# Patient Record
Sex: Female | Born: 1990
Health system: Southern US, Community
[De-identification: ages and names within clinical notes are randomized; demographics above are authoritative.]

## PROBLEM LIST (undated history)

## (undated) DIAGNOSIS — E282 Polycystic ovarian syndrome: Secondary | ICD-10-CM

## (undated) DIAGNOSIS — T7840XA Allergy, unspecified, initial encounter: Secondary | ICD-10-CM

## (undated) DIAGNOSIS — M25569 Pain in unspecified knee: Secondary | ICD-10-CM

## (undated) DIAGNOSIS — F32A Depression, unspecified: Secondary | ICD-10-CM

## (undated) DIAGNOSIS — R779 Abnormality of plasma protein, unspecified: Secondary | ICD-10-CM

## (undated) DIAGNOSIS — I10 Essential (primary) hypertension: Secondary | ICD-10-CM

## (undated) DIAGNOSIS — E559 Vitamin D deficiency, unspecified: Secondary | ICD-10-CM

## (undated) DIAGNOSIS — F329 Major depressive disorder, single episode, unspecified: Secondary | ICD-10-CM

## (undated) DIAGNOSIS — Z6841 Body Mass Index (BMI) 40.0 and over, adult: Secondary | ICD-10-CM

## (undated) DIAGNOSIS — L83 Acanthosis nigricans: Secondary | ICD-10-CM

## (undated) DIAGNOSIS — N915 Oligomenorrhea, unspecified: Secondary | ICD-10-CM

## (undated) HISTORY — DX: Abnormality of plasma protein, unspecified: R77.9

## (undated) HISTORY — DX: Acanthosis nigricans: L83

## (undated) HISTORY — DX: Allergy, unspecified, initial encounter: T78.40XA

## (undated) HISTORY — DX: Morbid (severe) obesity due to excess calories: E66.01

## (undated) HISTORY — DX: Polycystic ovarian syndrome: E28.2

## (undated) HISTORY — DX: Major depressive disorder, single episode, unspecified: F32.9

## (undated) HISTORY — DX: Depression, unspecified: F32.A

## (undated) HISTORY — DX: Essential (primary) hypertension: I10

## (undated) HISTORY — DX: Vitamin D deficiency, unspecified: E55.9

## (undated) HISTORY — DX: Pain in unspecified knee: M25.569

## (undated) HISTORY — DX: Oligomenorrhea, unspecified: N91.5

## (undated) HISTORY — DX: Body Mass Index (BMI) 40.0 and over, adult: Z684

---
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
## 2010-01-30 IMAGING — CR DG CHEST 2V
1 series · 2 of 2 positions shown · non-contrast
Comparison: none

REASON FOR EXAM: chest pain
COMMENTS:

[Series 1: view not recorded · 0.17mm/px · 2 of 2 slices shown]
[im 1/2]
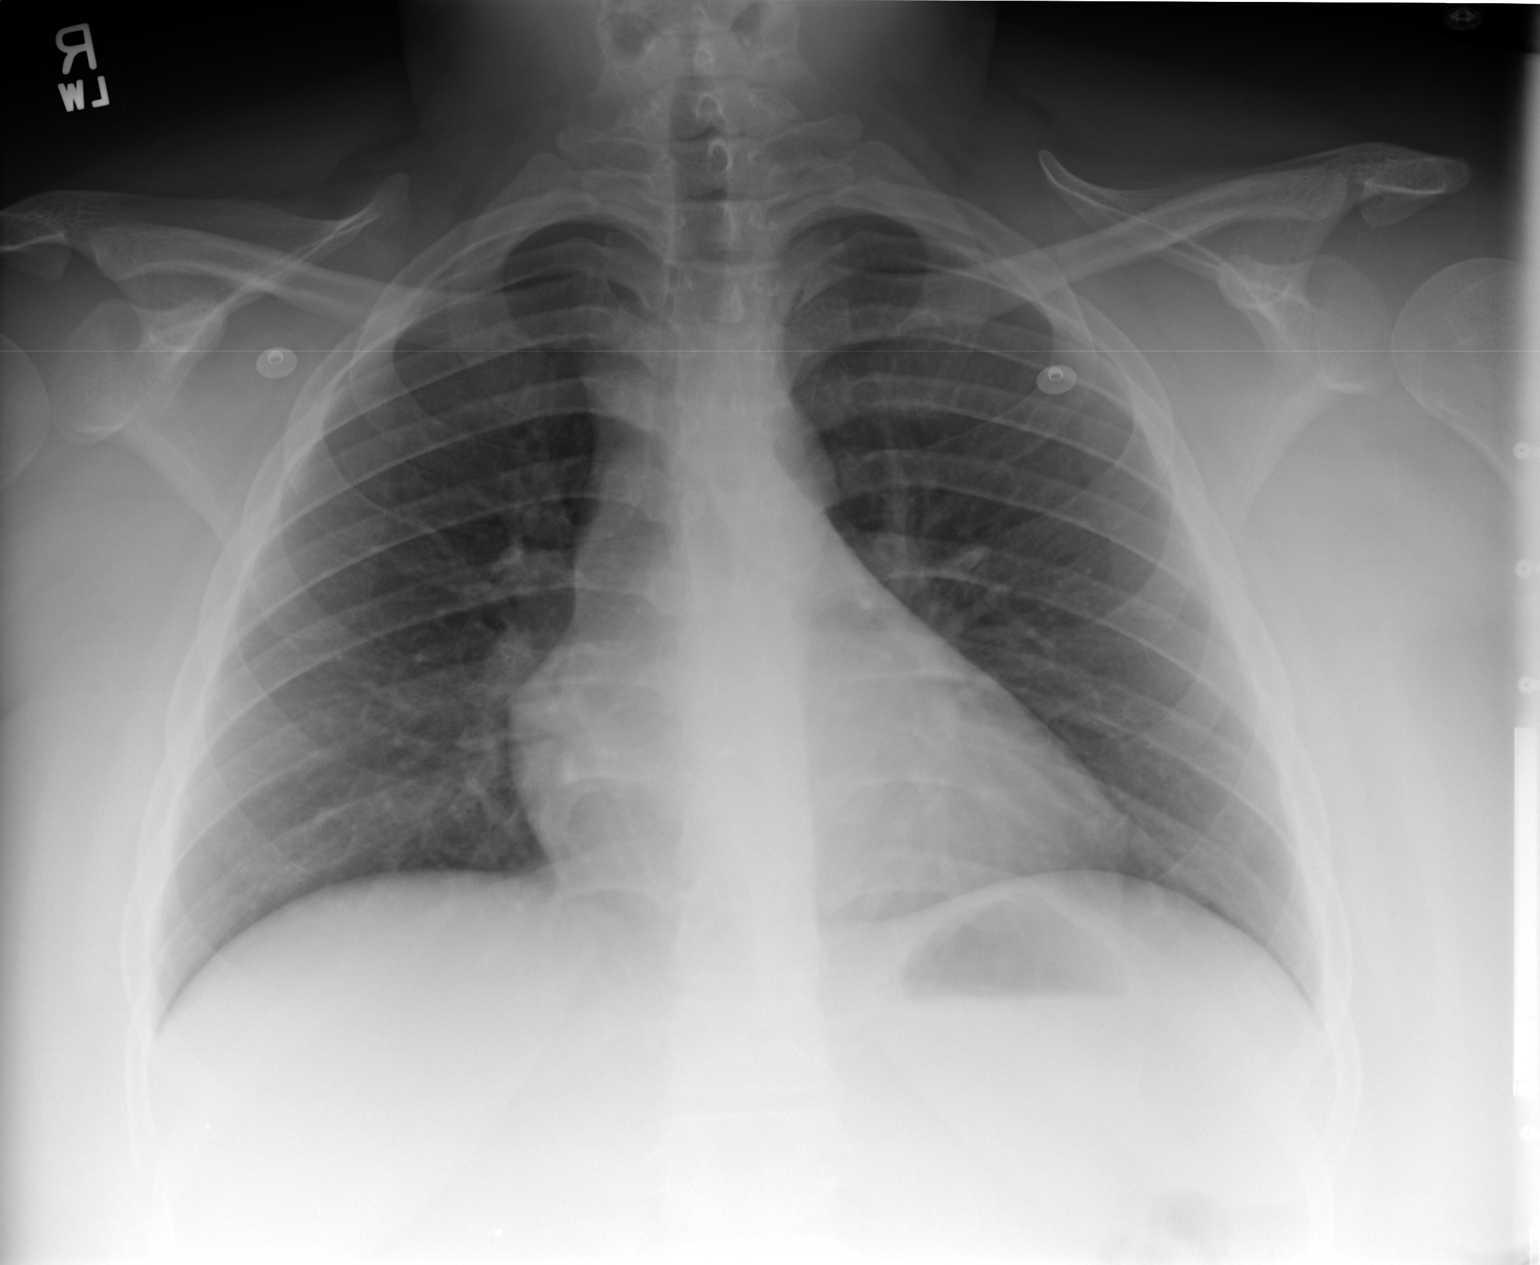
[im 2/2]
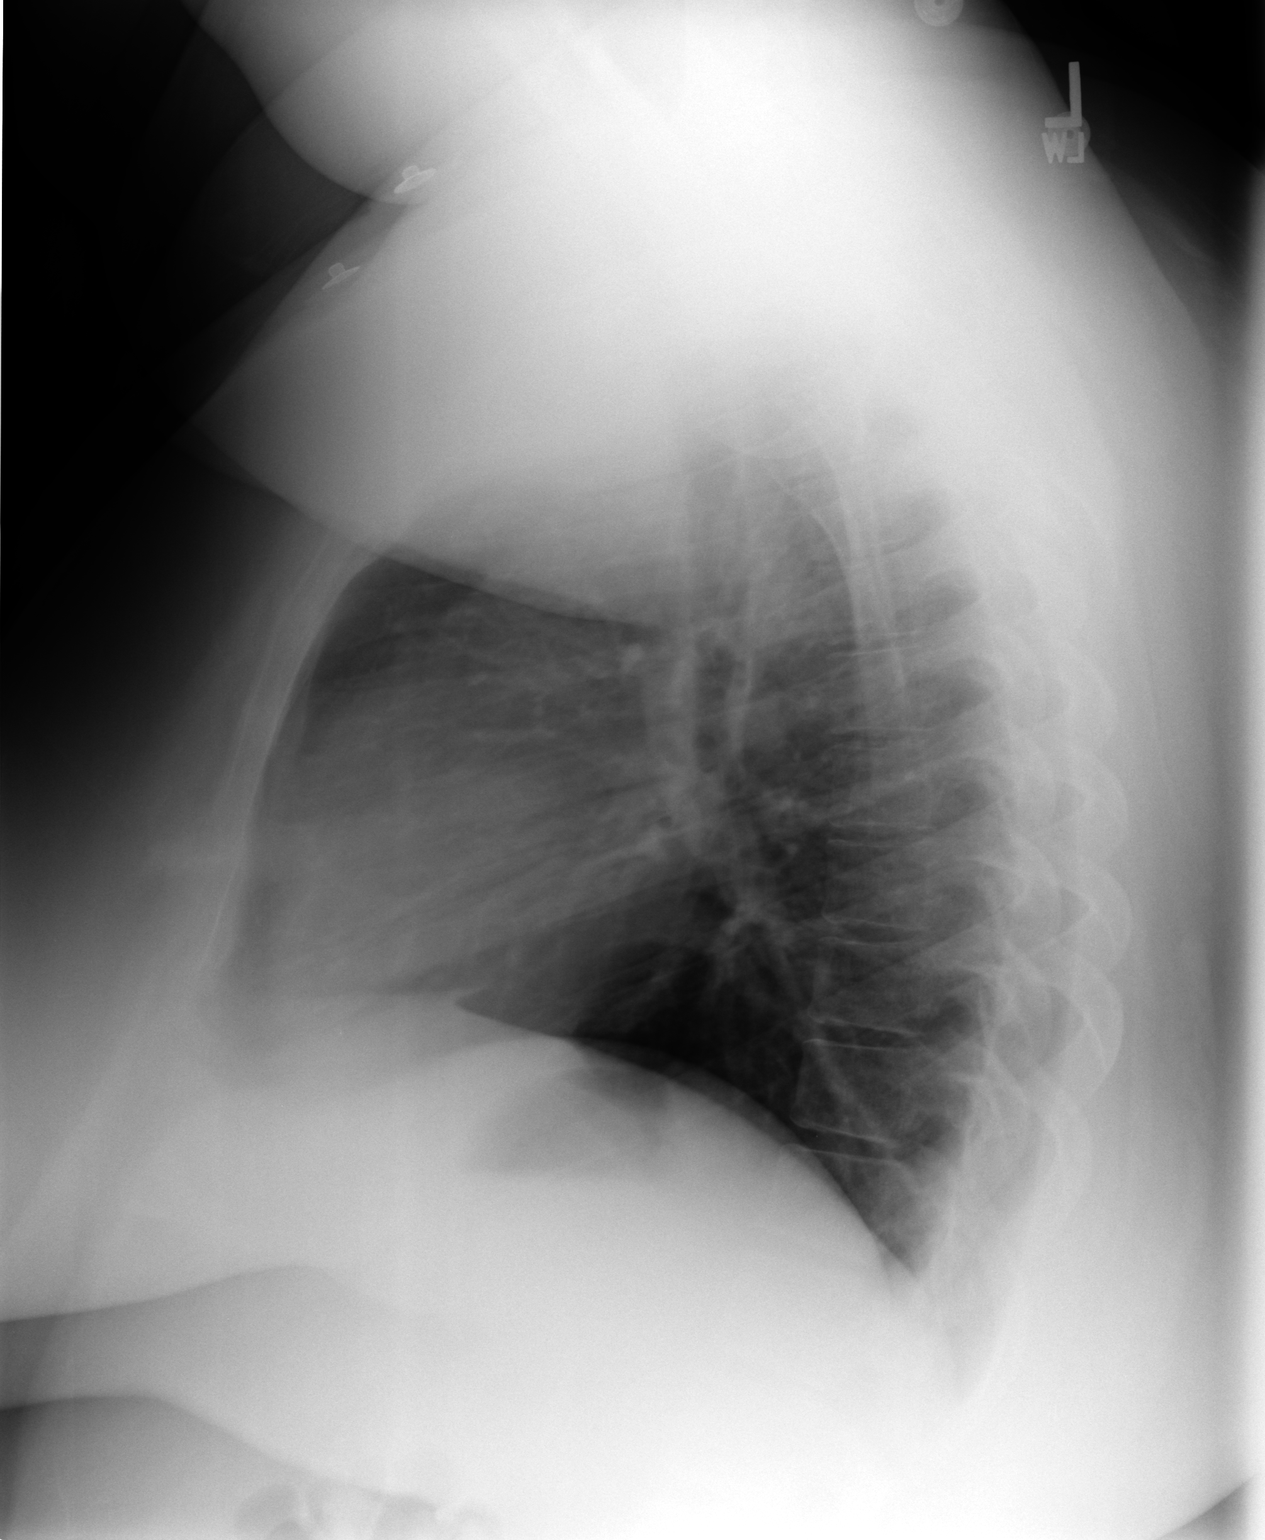

[2 of 2 positions shown; findings below may reference images not displayed]

PROCEDURE:     DXR - DXR CHEST PA (OR AP) AND LATERAL  - March 28, 2008 [DATE]

RESULT:     There is no previous exam for comparison.

The lungs are clear. The heart and pulmonary vessels are normal. The bony
and mediastinal structures are unremarkable. There is no effusion. There is
no pneumothorax or evidence of congestive failure.
IMPRESSION: No acute cardiopulmonary disease.

## 2010-02-06 IMAGING — CT CT HEAD WITHOUT CONTRAST
2 series · 16 of 30 positions shown, 20 images · non-contrast
Comparison: none

REASON FOR EXAM: vision change
COMMENTS:

[Series 2: without · axial · non-contrast · 0.41mm/px · z∈[+203,+328]mm · 13 of 31 slices shown, 17 images]
[im 3/31  brain]
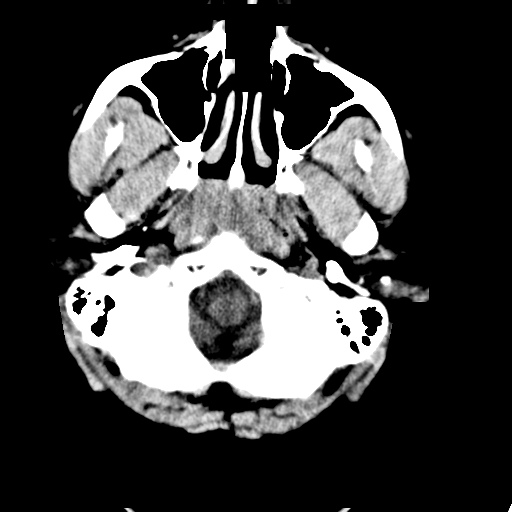
[im 3/31  bone]
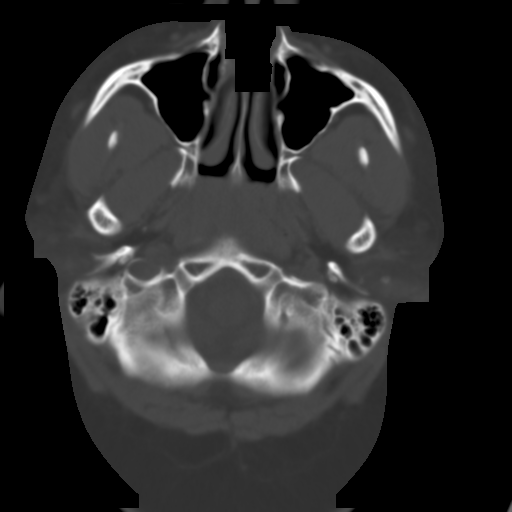
[im 5/31  brain]
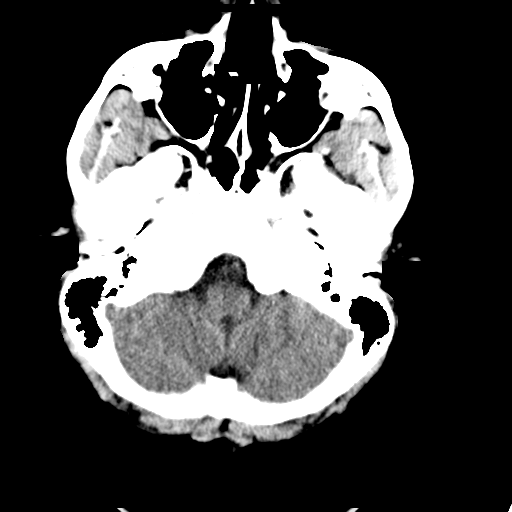
[im 7/31  brain]
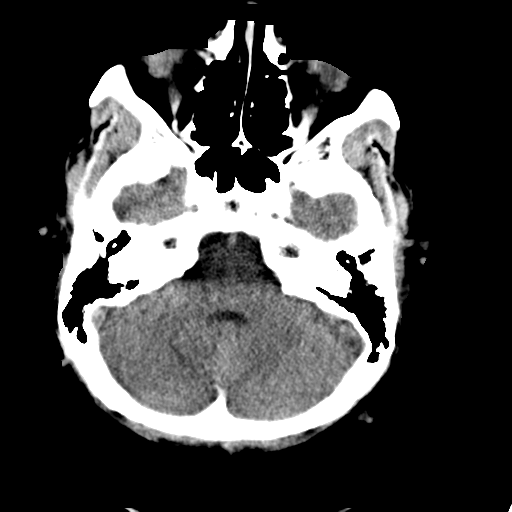
[im 9/31  brain]
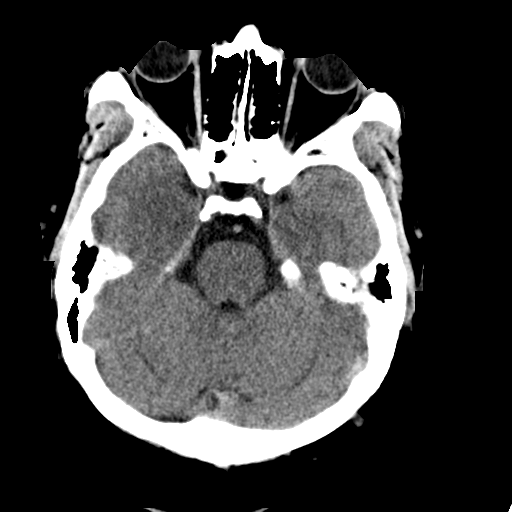
[im 11/31  brain]
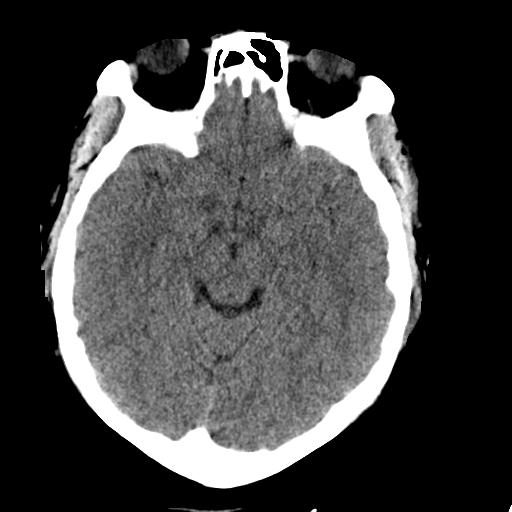
[im 11/31  bone]
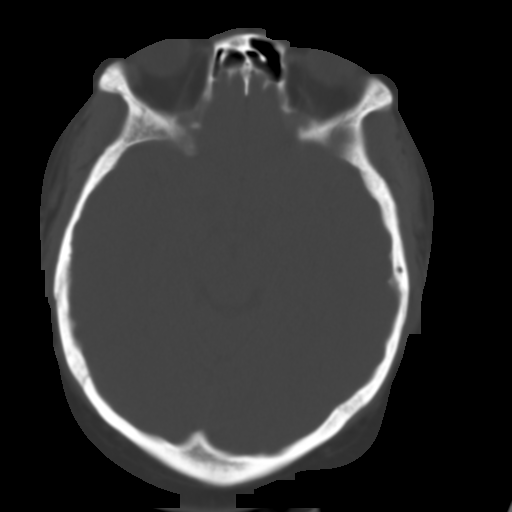
[im 13/31  brain]
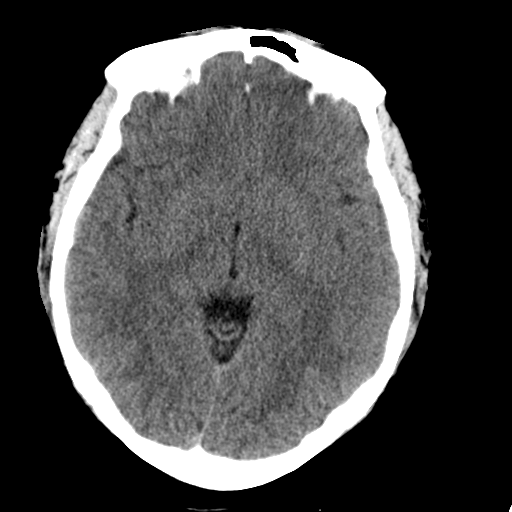
[im 16/31  brain]
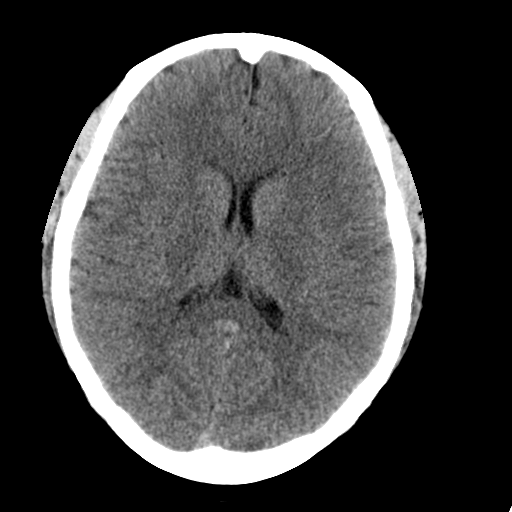
[im 18/31  brain]
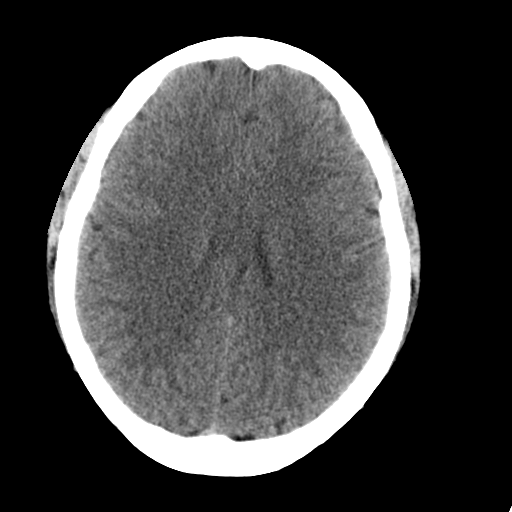
[im 20/31  brain]
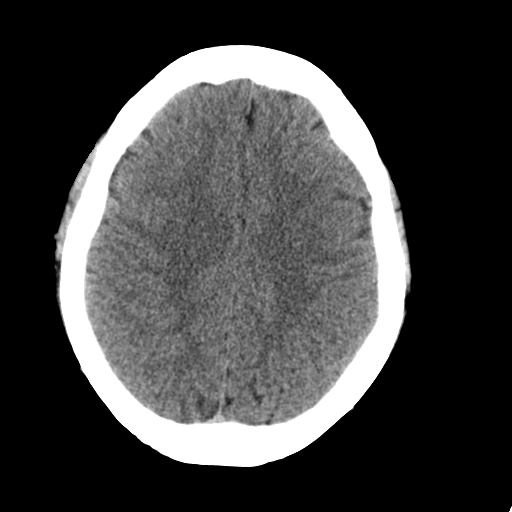
[im 20/31  bone]
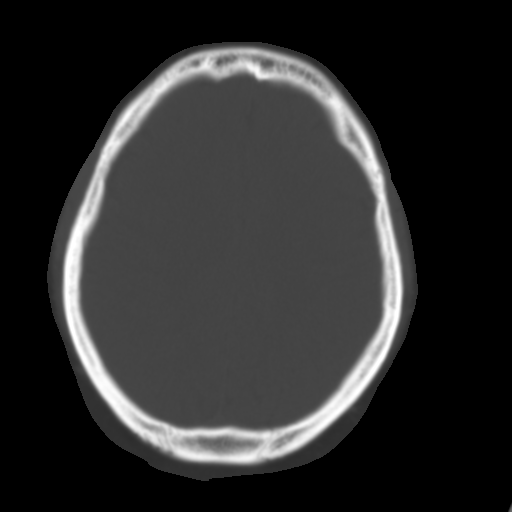
[im 22/31  brain]
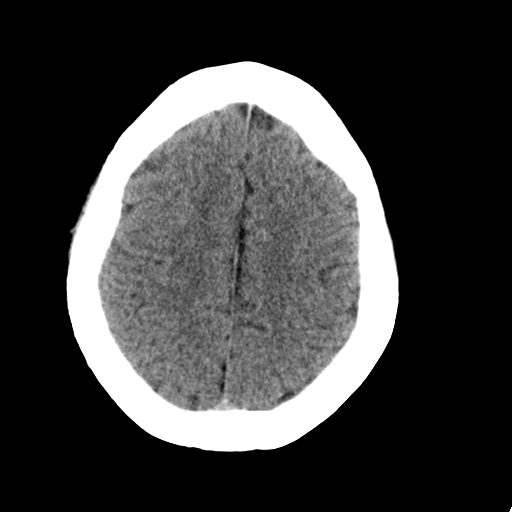
[im 24/31  brain]
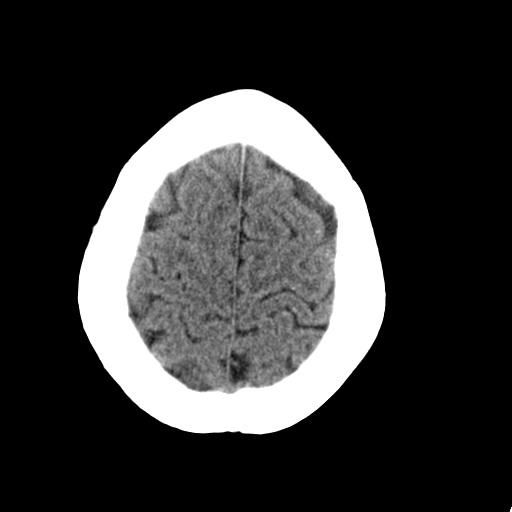
[im 26/31  brain]
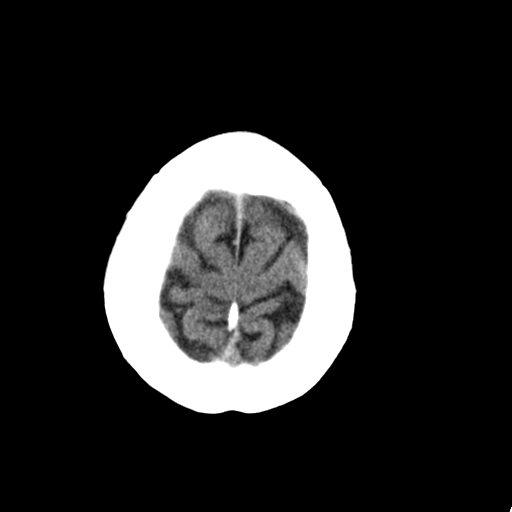
[im 28/31  brain]
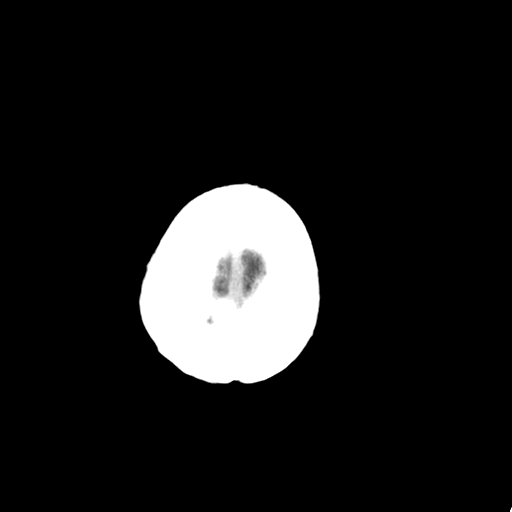
[im 28/31  bone]
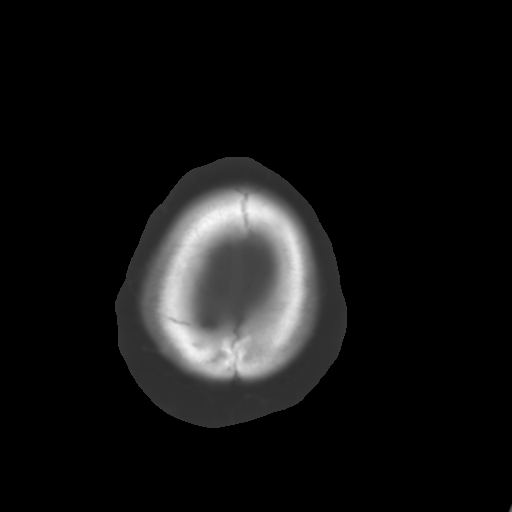

[Series 3: bone · axial · 0.41mm/px · z∈[+203,+243]mm · 3 of 31 slices shown]
[im 3/31  bone]
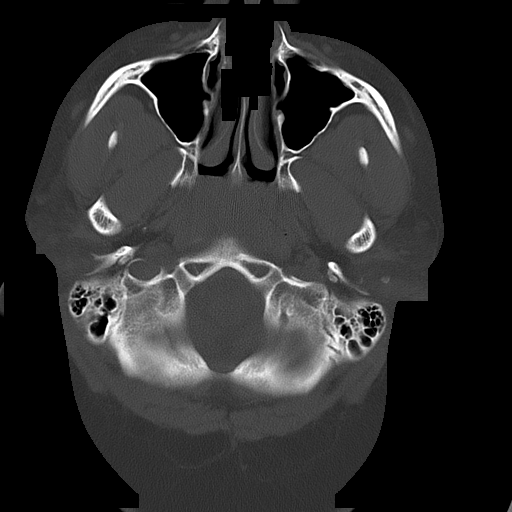
[im 7/31  bone]
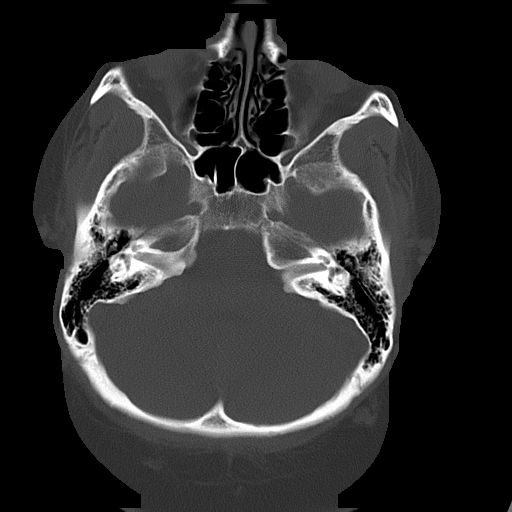
[im 11/31  bone]
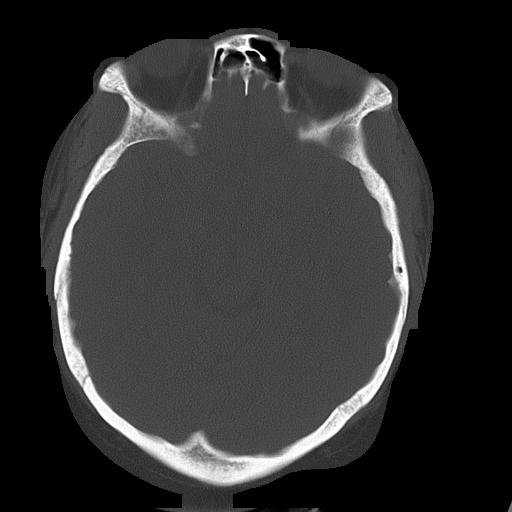

[16 of 30 positions shown; findings below may reference images not displayed]

PROCEDURE:     CT  - CT HEAD WITHOUT CONTRAST  - April 04, 2008 [DATE]

RESULT:     Axial CT scanning was performed through the brain at 5 mm
intervals and slice thicknesses. Images were acquired at brain and bone
windows.

The ventricles are normal in size and position. There is no intracranial
hemorrhage, mass effect, or findings to suggest an evolving ischemic
infarction. The cerebellum and brainstem are normal in density. At bone
window settings I do not see evidence of an acute skull fracture. The
observed portions of the paranasal sinuses are clear.
IMPRESSION: I see no acute intracranial abnormality. Given the
patient's visual symptoms, followup MRI may be useful.

## 2013-02-02 ENCOUNTER — Emergency Department: Payer: Self-pay | Admitting: Emergency Medicine

## 2013-06-21 LAB — HEMOGLOBIN A1C: Hgb A1c MFr Bld: 5.5 % (ref 4.0–6.0)

## 2013-06-24 ENCOUNTER — Other Ambulatory Visit: Payer: Self-pay | Admitting: Family Medicine

## 2013-06-24 LAB — LIPID PANEL
CHOLESTEROL: 173 mg/dL (ref 0–200)
CHOLESTEROL: 173 mg/dL (ref 0–200)
HDL Cholesterol: 54 mg/dL (ref 40–60)
HDL: 54 mg/dL (ref 35–70)
LDL CALC: 110 mg/dL
Ldl Cholesterol, Calc: 110 mg/dL — ABNORMAL HIGH (ref 0–100)
TRIGLYCERIDES: 45 mg/dL (ref 0–200)
Triglycerides: 45 mg/dL (ref 40–160)
VLDL Cholesterol, Calc: 9 mg/dL (ref 5–40)

## 2013-06-24 LAB — COMPREHENSIVE METABOLIC PANEL
ALK PHOS: 109 U/L
ALT: 34 U/L (ref 12–78)
AST: 30 U/L (ref 15–37)
Albumin: 3.6 g/dL (ref 3.4–5.0)
Anion Gap: 5 — ABNORMAL LOW (ref 7–16)
BUN: 11 mg/dL (ref 7–18)
Bilirubin,Total: 0.4 mg/dL (ref 0.2–1.0)
CALCIUM: 9.4 mg/dL (ref 8.5–10.1)
CHLORIDE: 104 mmol/L (ref 98–107)
Co2: 30 mmol/L (ref 21–32)
Creatinine: 0.75 mg/dL (ref 0.60–1.30)
Glucose: 82 mg/dL (ref 65–99)
Osmolality: 276 (ref 275–301)
Potassium: 3.9 mmol/L (ref 3.5–5.1)
Sodium: 139 mmol/L (ref 136–145)
TOTAL PROTEIN: 8.9 g/dL — AB (ref 6.4–8.2)

## 2013-06-24 LAB — HEMOGLOBIN A1C: Hemoglobin A1C: 5.5 % (ref 4.2–6.3)

## 2013-06-24 LAB — CBC WITH DIFFERENTIAL/PLATELET
Basophil #: 0 10*3/uL (ref 0.0–0.1)
Basophil %: 0.6 %
EOS ABS: 0.1 10*3/uL (ref 0.0–0.7)
EOS PCT: 1.8 %
HCT: 41.2 % (ref 35.0–47.0)
HGB: 13.8 g/dL (ref 12.0–16.0)
LYMPHS PCT: 37.5 %
Lymphocyte #: 2.2 10*3/uL (ref 1.0–3.6)
MCH: 28.7 pg (ref 26.0–34.0)
MCHC: 33.5 g/dL (ref 32.0–36.0)
MCV: 86 fL (ref 80–100)
MONOS PCT: 9.7 %
Monocyte #: 0.6 x10 3/mm (ref 0.2–0.9)
Neutrophil #: 3 10*3/uL (ref 1.4–6.5)
Neutrophil %: 50.4 %
PLATELETS: 317 10*3/uL (ref 150–440)
RBC: 4.82 10*6/uL (ref 3.80–5.20)
RDW: 13.7 % (ref 11.5–14.5)
WBC: 5.9 10*3/uL (ref 3.6–11.0)

## 2013-07-05 ENCOUNTER — Other Ambulatory Visit: Payer: Self-pay | Admitting: Family Medicine

## 2013-07-09 LAB — PROTEIN ELECTROPHORESIS(ARMC)

## 2014-07-17 ENCOUNTER — Ambulatory Visit: Payer: 59 | Admitting: Family Medicine

## 2014-08-02 ENCOUNTER — Encounter: Payer: Self-pay | Admitting: Family Medicine

## 2014-08-02 DIAGNOSIS — L239 Allergic contact dermatitis, unspecified cause: Secondary | ICD-10-CM | POA: Insufficient documentation

## 2014-08-02 DIAGNOSIS — E668 Other obesity: Secondary | ICD-10-CM | POA: Insufficient documentation

## 2014-08-02 DIAGNOSIS — E669 Obesity, unspecified: Secondary | ICD-10-CM | POA: Insufficient documentation

## 2014-08-02 DIAGNOSIS — I10 Essential (primary) hypertension: Secondary | ICD-10-CM | POA: Insufficient documentation

## 2014-08-02 DIAGNOSIS — L83 Acanthosis nigricans: Secondary | ICD-10-CM | POA: Insufficient documentation

## 2014-08-02 DIAGNOSIS — N915 Oligomenorrhea, unspecified: Secondary | ICD-10-CM | POA: Insufficient documentation

## 2014-08-02 DIAGNOSIS — E282 Polycystic ovarian syndrome: Secondary | ICD-10-CM | POA: Insufficient documentation

## 2014-08-02 DIAGNOSIS — J302 Other seasonal allergic rhinitis: Secondary | ICD-10-CM | POA: Insufficient documentation

## 2014-08-04 ENCOUNTER — Encounter: Payer: Self-pay | Admitting: Family Medicine

## 2014-08-04 ENCOUNTER — Ambulatory Visit (INDEPENDENT_AMBULATORY_CARE_PROVIDER_SITE_OTHER): Payer: 59 | Admitting: Family Medicine

## 2014-08-04 VITALS — BP 136/84 | HR 85 | Temp 98.8°F | Resp 16 | Ht 67.0 in | Wt >= 6400 oz

## 2014-08-04 DIAGNOSIS — E559 Vitamin D deficiency, unspecified: Secondary | ICD-10-CM | POA: Diagnosis not present

## 2014-08-04 DIAGNOSIS — L83 Acanthosis nigricans: Secondary | ICD-10-CM | POA: Diagnosis not present

## 2014-08-04 DIAGNOSIS — I10 Essential (primary) hypertension: Secondary | ICD-10-CM | POA: Diagnosis not present

## 2014-08-04 DIAGNOSIS — E668 Other obesity: Secondary | ICD-10-CM

## 2014-08-04 DIAGNOSIS — I890 Lymphedema, not elsewhere classified: Secondary | ICD-10-CM

## 2014-08-04 MED ORDER — ATENOLOL-CHLORTHALIDONE 50-25 MG PO TABS
1.0000 | ORAL_TABLET | Freq: Every day | ORAL | Status: DC
Start: 2014-08-04 — End: 2015-03-27

## 2014-08-04 MED ORDER — NALTREXONE-BUPROPION HCL ER 8-90 MG PO TB12: 2.0000 | ORAL_TABLET | Freq: Two times a day (BID) | ORAL | Status: DC

## 2014-08-04 NOTE — Progress Notes (Signed)
Name: Kimberly Cannon   MRN: [id number]    DOB: May 06, 1990   Date:08/04/2014       Progress Note  Subjective  Chief Complaint  Chief Complaint  Patient presents with  . Medication Refill    5 month F/U  . Hypertension    Edema in bilateral ankles  . Allergic Rhinitis     stable-no medications  . Obesity    Patient has gained 6 pounds since last visit.    HPI  HTN: she was diagnosed with HTN since HS. She has been taking Atenolol/chlortolidone for years and is tolerating it well. She denies chest pain or SOB .   Vitamin D deficiency: taking otc supplementation since last year, denies fatigue.  Morbid obesity: she has been obese since childhood. She skips meals, and binge eats at night.  She usually does not eat breakfast. She is cutting on sweets beverages. Drinking at most one can of sodas per day. She only tried losing weight once in her life without success. There is family history of DM and she has prediabetes now.   AR: doing well at this time, not taking medication  Patient Active Problem List   Diagnosis Date Noted  . Acanthosis nigricans 08/02/2014  . Benign essential HTN 08/02/2014  . Allergic contact dermatitis 08/02/2014  . Extreme obesity 08/02/2014  . Infrequent menses 08/02/2014  . Bilateral polycystic ovarian syndrome 08/02/2014  . Allergic rhinitis, seasonal 08/02/2014  . Vitamin D deficiency 10/12/2007    History reviewed. No pertinent past surgical history.  Family History  Problem Relation Age of Onset  . Diabetes Paternal Uncle   . Diabetes Paternal Grandfather     History   Social History  . Marital Status: Single    Spouse Name: N/A  . Number of Children: N/A  . Years of Education: N/A   Occupational History  . Not on file.   Social History Main Topics  . Smoking status: Never Smoker   . Smokeless tobacco: Never Used  . Alcohol Use: No  . Drug Use: No  . Sexual Activity: No   Other Topics Concern  . Not on file   Social  History Narrative  . No narrative on file     Current outpatient prescriptions:  .  atenolol-chlorthalidone (TENORETIC 50) 50-25 MG per tablet, Take 1 tablet by mouth daily., Disp: 30 tablet, Rfl: 5 .  cholecalciferol (VITAMIN D) 1000 UNITS tablet, Take 1,000 Units by mouth daily., Disp: , Rfl:  .  Naltrexone-Bupropion HCl ER 8-90 MG TB12, Take 2 tablets by mouth 2 (two) times daily., Disp: 120 tablet, Rfl: 1  Allergies  Allergen Reactions  . Penicillins      ROS  Constitutional: Negative for fever or weight change.  Respiratory: Negative for cough and shortness of breath.   Cardiovascular: Negative for chest pain or palpitations. She has mild ankle edema at the end of the day Gastrointestinal: Negative for abdominal pain, no bowel changes.  Musculoskeletal: Negative for gait problem or joint swelling.  Skin: Negative for rash.  Neurological: Negative for dizziness or headache.  No other specific complaints in a complete review of systems (except as listed in HPI above).  Objective  Filed Vitals:   08/04/14 1549  BP: 136/84  Pulse: 85  Temp: 98.8 F (37.1 C)  TempSrc: Oral  Resp: 16  Height: 5\' 7"  (1.702 m)  Weight: 416 lb (188.696 kg)  SpO2: 94%    Body mass index is 65.14 kg/(m^2).  Physical Exam  Constitutional: Patient appears well-developed and well-nourished. No distress. Morbid obese Eyes:  No scleral icterus. PERL Neck: Normal range of motion. Neck supple. Cardiovascular: Normal rate, regular rhythm and normal heart sounds.  No murmur heard. no pitting edema of both legs Pulmonary/Chest: Effort normal and breath sounds normal. No respiratory distress. Abdominal: Soft.  There is no tenderness. Psychiatric: Patient has a normal mood and affect. behavior is normal. Judgment and thought content normal. Skin: hypertrichosis on face  PHQ2/9: Depression screen PHQ 2/9 08/04/2014  Decreased Interest 0  Down, Depressed, Hopeless 0  PHQ - 2 Score 0     Fall  Risk: Fall Risk  08/04/2014  Falls in the past year? No     Assessment & Plan  1. Benign essential HTN  - Lipid Profile - Comprehensive Metabolic Panel (CMET) - atenolol-chlorthalidone (TENORETIC 50) 50-25 MG per tablet; Take 1 tablet by mouth daily.  Dispense: 30 tablet; Refill: 5  2. Acanthosis nigricans  - HgB A1c  3. Extreme obesity Discussed importance of breakfast daily, avoid any sweet beverages, increase physical activity - at least 30 minutes daily , do not eat after 8 pm - Naltrexone-Bupropion HCl ER 8-90 MG TB12; Take 2 tablets by mouth 2 (two) times daily.  Dispense: 120 tablet; Refill: 1  Dietician

## 2014-08-04 NOTE — Patient Instructions (Addendum)
Lymphedema Lymphedema is a swelling caused by the abnormal collection of lymph under the skin. The lymph is fluid from the tissues in your body that travels in the lymphatic system. This system is part of the immune system that includes lymph nodes and vessels. The lymph vessels collect and carry the excess fluid, fats, proteins, and wastes from the tissues of the body to the bloodstream. This system also works to clean and remove bacteria and waste products from the body.  Lymphedema occurs when the lymphatic system is blocked. When the lymph vessels or lymph nodes are blocked or damaged, lymph does not drain properly. This causes abnormal build up of lymph. This leads to swelling in the arms or legs. Lymphedema cannot be cured by medicines. But the swelling can be reduced by physical methods. CAUSES  There are two types of lymphedema. Primary lymphedema is caused by the absence or abnormality of the lymph vessel at birth. It is also known as inherited lymphedema, which occurs rarely. Secondary or acquired lymphedema occurs when the lymph vessel is damaged or blocked. The causes of lymph vessel blockage are:   Skin infection like cellulites.  Infection by parasites (filariasis).  Injury.  Cancer.  Radiation therapy.  Formation of scar tissue.  Surgery. SYMPTOMS  The symptoms of lymphedema are:  Abnormal swelling of the arm or leg.  Heavy or tight feeling in your arm or leg.  Tight-fitting shoes or rings.  Redness of skin over the affected area.  Limited movement of the affected limb.  Some patients complain about sensitivity to touch and discomfort in the limb(s) affected. You may not have these symptoms immediately following injury. They usually appear within a few days or even years after injury. Inform your caregiver, if you have any of these symptoms. Early treatment can avoid further problems.  DIAGNOSIS  First, your caregiver will inquire about any surgery you have had or  medicines you are taking. He will then examine you. Your caregiver may order special imaging tests, such as:  Lymphoscintigraphy (a test in which a low dose of radioactive substance is injected to trace the flow of lymph through the lymph vessels).  MRI (imaging tests using magnetic fields).  Computed tomography (test using special cross-sectional X-rays).  Duplex ultrasound (test using high-frequency sound waves to show the vessels and the blood flow on a screen).  Lymphangiography (special X-ray taken after injecting a contrast dye into the lymph vessel). It is now rarely done. TREATMENT  Lymphedema can be treated in different ways. Your caregiver will decide the type of treatment depending on the cause. Treatment may include:  Exercise: Special exercises will help fluid move out easily from the affected part. This should be done as per your caregiver's advice.  Manual lymph drainage: Gentle massage of the affected limb makes the fluid to move out more freely.  Compression: Compression stockings or external pump apply pressure over the affected limb. This helps the fluid to move out from the arm or leg. Bandaging can also help to move the fluid out from the affected part. Your caregiver will decide the method that suits you the best.  Medicines: Your caregiver may prescribe antibiotics, if you have infection.  Surgery: Your caregiver may advise surgery for severe lymphedema. It is reserved for special cases when the patient has difficulty moving. Your surgeon may remove excess tissue from the arm or leg. This will help to ease your movement. Physical therapy may have to be continued after surgery. HOME CARE INSTRUCTIONS    The area is very fragile and is predisposed to injury and infection.  Eat a healthy diet.  Exercise regularly as per advice.  Keep the affected area clean and dry.  Use gloves while cooking or gardening.  Protect your skin from cuts.  Use electric razor to  shave the affected area.  Keep affected limb elevated.  Do not wear tight clothes, shoes, or jewelry as it may cause the tissue to be strangled.  Do not use heat pads over the affected area.  Do not sit with cross legs.  Do not walk barefoot.  Do not carry weight on the affected arm.  Avoid having blood pressure checked on the affected limb. SEEK MEDICAL CARE IF:  You continue to have swelling in your limb. SEEK IMMEDIATE MEDICAL CARE IF:   You have high fever.  You have skin rash.  You have chills or sweats.  You have pain or redness.  You have a cut that does not heal. MAKE SURE YOU:   Understand these instructions.  Will watch your condition.  Will get help right away if you are not doing well or get worse. Document Released: 11/28/2006 Document Revised: 01/18/2012 Document Reviewed: 11/03/2008 [facility] Patient Information 2015 [place], [place]. This information is not intended to replace advice given to you by your health care provider. Make sure you discuss any questions you have with your health care provider. Bupropion; Naltrexone extended-release tablets What is this medicine? BUPROPION; NALTREXONE (byoo PROE pee on; nal TREX one) is a combination product used to promote and maintain weight loss in obese adults or overweight adults who also have weight related medical problems. This medicine should be used with a reduced calorie diet and increased physical activity. This medicine may be used for other purposes; ask your health care provider or pharmacist if you have questions. COMMON BRAND NAME(S): CONTRAVE What should I tell my health care provider before I take this medicine? They need to know if you have any of these conditions: -an eating disorder, such as anorexia or bulimia -diabetes -glaucoma -head injury -heart disease -high blood pressure -history of a drug or alcohol abuse problem -history of a tumor or infection of your brain or spine -history of  stroke -history of irregular heartbeat -kidney disease -liver disease -mental illness such as bipolar disorder or psychosis -seizures -suicidal thoughts, plans, or attempt; a previous suicide attempt by you or a family member -an unusual or allergic reaction to bupropion, naltrexone, other medicines, foods, dyes, or preservatives breast-feeding -pregnant or trying to become pregnant How should I use this medicine? Take this medicine by mouth with a glass of water. Follow the directions on the prescription label. Take this medicine in the morning and in the evenings as directed by your healthcare professional. Dennis Bast can take it with or without food. Do not take with high-fat meals as this may increase your risk of seizures. Do not crush, chew, or cut these tablets. Do not take your medicine more often than directed. Do not stop taking this medicine suddenly except upon the advice of your doctor. A special MedGuide will be given to you by the pharmacist with each prescription and refill. Be sure to read this information carefully each time. Talk to your pediatrician regarding the use of this medicine in children. Special care may be needed. Overdosage: If you think you've taken too much of this medicine contact a poison control center or emergency room at once. Overdosage: If you think you have taken too much of  this medicine contact a poison control center or emergency room at once. NOTE: This medicine is only for you. Do not share this medicine with others. What if I miss a dose? If you miss a dose, skip the missed dose and take your next tablet at the regular time. Do not take double or extra doses. What may interact with this medicine? Do not take this medicine with any of the following medications: -any prescription or street opioid drug like codiene, heroin, methadone -linezolid -MAOIs like Carbex, Eldepryl, Marplan, Nardil, and Parnate -methylene blue (injected into a vein) -other  medicines that contain bupropion like Zyban or Wellbutrin This medicine may also interact with the following medications: -alcohol -certain medicines for anxiety or sleep -certain medicines for blood pressure like metoprolol, propranolol -certain medicines for depression or psychotic disturbances -certain medicines for HIV or AIDS like efavirenz, lopinavir, nelfinavir, ritonavir -certain medicines for irregular heart beat like propafenone, flecainide -certain medicines for Parkinson's disease like amantadine, levodopa -certain medicines for seizures like carbamazepine, phenytoin, phenobarbital -cimetidine -clopidogrel -cyclophosphamide -disulfiram -furazolidone -isoniazid -nicotine -orphenadrine -procarbazine -steroid medicines like prednisone or cortisone -stimulant medicines for attention disorders, weight loss, or to stay awake -tamoxifen -theophylline -thioridazine -thiotepa -ticlopidine -tramadol -warfarin This list may not describe all possible interactions. Give your health care provider a list of all the medicines, herbs, non-prescription drugs, or dietary supplements you use. Also tell them if you smoke, drink alcohol, or use illegal drugs. Some items may interact with your medicine. What should I watch for while using this medicine? This medicine is intended to be used in addition to a healthy diet and appropriate exercise. The best results are achieved this way. Do not increase or in any way change your dose without consulting your doctor or health care professional. Do not take this medicine with other prescription or over-the-counter weight loss products without consulting your doctor or health care professional. Your doctor should tell you to stop taking this medicine if you do not lose a certain amount of weight within the first 12 weeks of treatment. Visit your doctor or health care professional for regular checkups. Your doctor may order blood tests or other tests to  see how you are doing. This medicine may affect blood sugar levels. If you have diabetes, check with your doctor or health care professional before you change your diet or the dose of your diabetic medicine. Patients and their families should watch out for new or worsening depression or thoughts of suicide. Also watch out for sudden changes in feelings such as feeling anxious, agitated, panicky, irritable, hostile, aggressive, impulsive, severely restless, overly excited and hyperactive, or not being able to sleep. If this happens, especially at the beginning of treatment or after a change in dose, call your health care professional. Avoid alcoholic drinks while taking this medicine. Drinking large amounts of alcoholic beverages, using sleeping or anxiety medicines, or quickly stopping the use of these agents while taking this medicine may increase your risk for a seizure. What side effects may I notice from receiving this medicine? Side effects that you should report to your doctor or health care professional as soon as possible: -allergic reactions like skin rash, itching or hives, swelling of the face, lips, or tongue -breathing problems -changes in vision, hearing -chest pain -confusion -dark urine -depressed mood -fast or irregular heart beat -fever -hallucination, loss of contact with reality -increased blood pressure -light-colored stools -redness, blistering, peeling or loosening of the skin, including inside the mouth -right upper belly  pain -seizures -suicidal thoughts or other mood changes -unusually weak or tired -vomiting -yellowing of the eyes or skin Side effects that usually do not require medical attention (Report these to your doctor or health care professional if they continue or are bothersome.): -constipation -diarrhea -dizziness -dry mouth -headache -nausea -trouble sleeping This list may not describe all possible side effects. Call your doctor for medical  advice about side effects. You may report side effects to FDA at 1-800-FDA-1088. Where should I keep my medicine? Keep out of the reach of children. Store at room temperature between 15 and 30 degrees C (59 and 86 degrees F). Throw away any unused medicine after the expiration date. NOTE: This sheet is a summary. It may not cover all possible information. If you have questions about this medicine, talk to your doctor, pharmacist, or health care provider.  2015, [employer]vier/[employer] Standard. (2012-11-07 15:17:29)

## 2014-09-19 ENCOUNTER — Ambulatory Visit: Payer: 59 | Admitting: Family Medicine

## 2014-09-24 ENCOUNTER — Ambulatory Visit: Payer: 59 | Admitting: Family Medicine

## 2014-11-15 ENCOUNTER — Other Ambulatory Visit: Payer: Self-pay | Admitting: Family Medicine

## 2015-03-27 ENCOUNTER — Ambulatory Visit (INDEPENDENT_AMBULATORY_CARE_PROVIDER_SITE_OTHER): Payer: 59 | Admitting: Family Medicine

## 2015-03-27 ENCOUNTER — Encounter: Payer: Self-pay | Admitting: Family Medicine

## 2015-03-27 VITALS — BP 136/72 | HR 75 | Temp 98.3°F | Resp 16 | Ht 67.0 in | Wt >= 6400 oz

## 2015-03-27 DIAGNOSIS — E559 Vitamin D deficiency, unspecified: Secondary | ICD-10-CM

## 2015-03-27 DIAGNOSIS — J302 Other seasonal allergic rhinitis: Secondary | ICD-10-CM | POA: Diagnosis not present

## 2015-03-27 DIAGNOSIS — I10 Essential (primary) hypertension: Secondary | ICD-10-CM | POA: Diagnosis not present

## 2015-03-27 DIAGNOSIS — L83 Acanthosis nigricans: Secondary | ICD-10-CM | POA: Diagnosis not present

## 2015-03-27 MED ORDER — ATENOLOL-CHLORTHALIDONE 50-25 MG PO TABS: 1.0000 | ORAL_TABLET | Freq: Every day | ORAL | Status: DC

## 2015-03-27 NOTE — Progress Notes (Signed)
Name: Kimberly Cannon   MRN: [id number]    DOB: 06/18/1990   Date:03/27/2015       Progress Note  Subjective  Chief Complaint  Chief Complaint  Patient presents with  . Medication Refill    6 month F/U  . Hypertension    no symptoms or side effects from medication  . Obesity    Patient took a month of contrave but never came back for 6 week follow up, states it helped with appetite but not with energy.     HPI  HTN: she has been taking medications as prescribed, no side effects. She gets bp checked at work, and is around 130's/80's. No chest pain or palpitation  Morbid Obesity: she has been obese all her life, we gave her Contrave on her last visit but she only took one month of the medication and lost to follow up. She skips meals, eats large amounts when she eats. Had a foot long sandwich and juice for dinner last night. She is not physically active. She is starting to have bilateral knee pain when using stairs.   Acanthosis Nigricans: she did not have labs done. She has polydipsia, but denies polyuria or polyphagia.   AR: under control at this time, takes medications prn. She usually has itchy eyes.    Patient Active Problem List   Diagnosis Date Noted  . Lymphedema 08/04/2014  . Acanthosis nigricans 08/02/2014  . Benign essential HTN 08/02/2014  . Allergic contact dermatitis 08/02/2014  . Extreme obesity ([place]) 08/02/2014  . Infrequent menses 08/02/2014  . Bilateral polycystic ovarian syndrome 08/02/2014  . Allergic rhinitis, seasonal 08/02/2014  . Vitamin D deficiency 10/12/2007    History reviewed. No pertinent past surgical history.  Family History  Problem Relation Age of Onset  . Diabetes Paternal Uncle   . Diabetes Paternal Grandfather     Social History   Social History  . Marital Status: Single    Spouse Name: N/A  . Number of Children: N/A  . Years of Education: N/A   Occupational History  . Not on file.   Social History Main Topics  . Smoking  status: Never Smoker   . Smokeless tobacco: Never Used  . Alcohol Use: No  . Drug Use: No  . Sexual Activity: No   Other Topics Concern  . Not on file   Social History Narrative     Current outpatient prescriptions:  .  atenolol-chlorthalidone (TENORETIC 50) 50-25 MG tablet, Take 1 tablet by mouth daily., Disp: 30 tablet, Rfl: 5 .  cholecalciferol (VITAMIN D) 1000 UNITS tablet, Take 1,000 Units by mouth daily., Disp: , Rfl:   Allergies  Allergen Reactions  . Penicillins      ROS  Constitutional: Negative for fever , positive for  weight change.  Respiratory: Negative for cough and shortness of breath.   Cardiovascular: Negative for chest pain or palpitations.  Gastrointestinal: Negative for abdominal pain, no bowel changes.  Musculoskeletal: Negative for gait problem or joint swelling.  Skin: Negative for rash.  Neurological: Negative for dizziness or headache.  No other specific complaints in a complete review of systems (except as listed in HPI above).  Objective  Filed Vitals:   03/27/15 1055  BP: 136/72  Pulse: 75  Temp: 98.3 F (36.8 C)  TempSrc: Oral  Resp: 16  Height: 5\' 7"  (1.702 m)  Weight: 426 lb 4.8 oz (193.368 kg)  SpO2: 98%    Body mass index is 66.75 kg/(m^2).  Physical Exam  Constitutional: Patient appears well-developed and well-nourished. Obese  No distress.  HEENT: head atraumatic, normocephalic, pupils equal and reactive to light,  neck supple, throat within normal limits Cardiovascular: Normal rate, regular rhythm and normal heart sounds.  No murmur heard. No BLE edema. Pulmonary/Chest: Effort normal and breath sounds normal. No respiratory distress. Abdominal: Soft.  There is no tenderness. Psychiatric: Patient has a normal mood and affect. behavior is normal. Judgment and thought content normal.  PHQ2/9: Depression screen [facility] 2/9 03/27/2015 08/04/2014  Decreased Interest 0 0  Down, Depressed, Hopeless 0 0  PHQ - 2 Score 0 0      Fall Risk: Fall Risk  03/27/2015 08/04/2014  Falls in the past year? No No     Functional Status Survey: Is the patient deaf or have difficulty hearing?: No Does the patient have difficulty seeing, even when wearing glasses/contacts?: No Does the patient have difficulty concentrating, remembering, or making decisions?: No Does the patient have difficulty walking or climbing stairs?: No Does the patient have difficulty dressing or bathing?: No Does the patient have difficulty doing errands alone such as visiting a doctor's office or shopping?: No    Assessment & Plan  1. Benign essential HTN  - Lipid panel - Comprehensive metabolic panel - CBC with Differential/Platelet - atenolol-chlorthalidone (TENORETIC 50) 50-25 MG tablet; Take 1 tablet by mouth daily.  Dispense: 30 tablet; Refill: 5  2. Morbid obesity, unspecified obesity type ([place])  Discussed diet, we will refer her for possible bariatric surgery . - Lipid panel - TSH - Ambulatory referral to General Surgery  3. Allergic rhinitis, seasonal  Discussed otc loratadine prn   4. Vitamin D deficiency  - VITAMIN D 25 Hydroxy (Vit-D Deficiency, Fractures)  5. Acanthosis nigricans  - Hemoglobin A1c

## 2015-03-28 LAB — CBC WITH DIFFERENTIAL/PLATELET
Basophils Absolute: 0 10*3/uL (ref 0.0–0.2)
Basos: 0 %
EOS (ABSOLUTE): 0.2 10*3/uL (ref 0.0–0.4)
Eos: 3 %
HEMATOCRIT: 37.7 % (ref 34.0–46.6)
Hemoglobin: 12.5 g/dL (ref 11.1–15.9)
Immature Grans (Abs): 0 10*3/uL (ref 0.0–0.1)
Immature Granulocytes: 0 %
LYMPHS ABS: 3.2 10*3/uL — AB (ref 0.7–3.1)
LYMPHS: 49 %
MCH: 27.2 pg (ref 26.6–33.0)
MCHC: 33.2 g/dL (ref 31.5–35.7)
MCV: 82 fL (ref 79–97)
MONOS ABS: 0.4 10*3/uL (ref 0.1–0.9)
Monocytes: 7 %
NEUTROS PCT: 41 %
Neutrophils Absolute: 2.7 10*3/uL (ref 1.4–7.0)
PLATELETS: 385 10*3/uL — AB (ref 150–379)
RBC: 4.6 x10E6/uL (ref 3.77–5.28)
RDW: 14.5 % (ref 12.3–15.4)
WBC: 6.6 10*3/uL (ref 3.4–10.8)

## 2015-03-28 LAB — TSH: TSH: 2.8 u[IU]/mL (ref 0.450–4.500)

## 2015-03-28 LAB — COMPREHENSIVE METABOLIC PANEL
A/G RATIO: 1 — AB (ref 1.1–2.5)
ALBUMIN: 3.8 g/dL (ref 3.5–5.5)
ALT: 21 IU/L (ref 0–32)
AST: 17 IU/L (ref 0–40)
Alkaline Phosphatase: 110 IU/L (ref 39–117)
BUN/Creatinine Ratio: 16 (ref 8–20)
BUN: 9 mg/dL (ref 6–20)
Bilirubin Total: 0.3 mg/dL (ref 0.0–1.2)
CO2: 28 mmol/L (ref 18–29)
Calcium: 9.4 mg/dL (ref 8.7–10.2)
Chloride: 99 mmol/L (ref 96–106)
Creatinine, Ser: 0.56 mg/dL — ABNORMAL LOW (ref 0.57–1.00)
GFR, EST AFRICAN AMERICAN: 150 mL/min/{1.73_m2} (ref >59)
GFR, EST NON AFRICAN AMERICAN: 130 mL/min/{1.73_m2} (ref >59)
GLOBULIN, TOTAL: 3.8 g/dL (ref 1.5–4.5)
Glucose: 83 mg/dL (ref 65–99)
POTASSIUM: 4 mmol/L (ref 3.5–5.2)
Sodium: 139 mmol/L (ref 134–144)
TOTAL PROTEIN: 7.6 g/dL (ref 6.0–8.5)

## 2015-03-28 LAB — LIPID PANEL
CHOLESTEROL TOTAL: 178 mg/dL (ref 100–199)
Chol/HDL Ratio: 3.2 ratio units (ref 0.0–4.4)
HDL: 56 mg/dL (ref >39)
LDL Calculated: 111 mg/dL — ABNORMAL HIGH (ref 0–99)
TRIGLYCERIDES: 53 mg/dL (ref 0–149)
VLDL Cholesterol Cal: 11 mg/dL (ref 5–40)

## 2015-03-28 LAB — HEMOGLOBIN A1C
Est. average glucose Bld gHb Est-mCnc: 111 mg/dL
Hgb A1c MFr Bld: 5.5 % (ref 4.8–5.6)

## 2015-03-28 LAB — VITAMIN D 25 HYDROXY (VIT D DEFICIENCY, FRACTURES): VIT D 25 HYDROXY: 15.5 ng/mL — AB (ref 30.0–100.0)

## 2015-03-29 ENCOUNTER — Other Ambulatory Visit: Payer: Self-pay | Admitting: Family Medicine

## 2015-03-29 MED ORDER — VITAMIN D (ERGOCALCIFEROL) 1.25 MG (50000 UNIT) PO CAPS: 50000.0000 [IU] | ORAL_CAPSULE | ORAL | Status: DC

## 2015-05-27 ENCOUNTER — Encounter: Payer: Self-pay | Admitting: Physician Assistant

## 2015-05-27 ENCOUNTER — Ambulatory Visit: Payer: Self-pay | Admitting: Physician Assistant

## 2015-05-27 VITALS — BP 125/80 | HR 79 | Temp 97.5°F

## 2015-05-27 DIAGNOSIS — N390 Urinary tract infection, site not specified: Secondary | ICD-10-CM

## 2015-05-27 DIAGNOSIS — R319 Hematuria, unspecified: Principal | ICD-10-CM

## 2015-05-27 LAB — POCT URINALYSIS DIPSTICK
NITRITE UA: POSITIVE
SPEC GRAV UA: 1.025
pH, UA: 5

## 2015-05-27 MED ORDER — CIPROFLOXACIN HCL 250 MG PO TABS: 250.0000 mg | ORAL_TABLET | Freq: Two times a day (BID) | ORAL | Status: DC

## 2015-05-27 NOTE — Progress Notes (Signed)
S:  C/o uti sx for 2 days, burning, urgency, frequency, denies vaginal discharge, abdominal pain or flank pain:  Remainder ros neg  O:  Vitals wnl, nad, no cva tenderness, back nontender, lungs c t a,cv rrr, abd soft nontender, bs normal, n/v intact, ua 3+ leuks, +nitrites, 2+ blood  A: uti  P: cipro 250mg  bid x 7d, increase water intake, add cranberry juice, return if not improving in 2 -3 days, return earlier if worsening, discussed pyelonephritis sx

## 2015-09-23 ENCOUNTER — Ambulatory Visit: Payer: 59 | Admitting: Family Medicine

## 2015-09-29 ENCOUNTER — Encounter: Payer: Self-pay | Admitting: Family Medicine

## 2015-09-29 ENCOUNTER — Ambulatory Visit (INDEPENDENT_AMBULATORY_CARE_PROVIDER_SITE_OTHER): Payer: 59 | Admitting: Family Medicine

## 2015-09-29 VITALS — BP 124/78 | HR 95 | Temp 98.9°F | Resp 18 | Ht 67.0 in | Wt 398.1 lb

## 2015-09-29 DIAGNOSIS — I10 Essential (primary) hypertension: Secondary | ICD-10-CM | POA: Diagnosis not present

## 2015-09-29 DIAGNOSIS — D75839 Thrombocytosis, unspecified: Secondary | ICD-10-CM

## 2015-09-29 DIAGNOSIS — E559 Vitamin D deficiency, unspecified: Secondary | ICD-10-CM | POA: Diagnosis not present

## 2015-09-29 DIAGNOSIS — L83 Acanthosis nigricans: Secondary | ICD-10-CM

## 2015-09-29 DIAGNOSIS — D473 Essential (hemorrhagic) thrombocythemia: Secondary | ICD-10-CM

## 2015-09-29 MED ORDER — ATENOLOL-CHLORTHALIDONE 50-25 MG PO TABS: 1.0000 | ORAL_TABLET | Freq: Every day | ORAL | 5 refills | Status: DC

## 2015-09-29 NOTE — Progress Notes (Signed)
Name: Kimberly Cannon   MRN: [id number]    DOB: July 20, 1990   Date:09/29/2015       Progress Note  Subjective  Chief Complaint  Chief Complaint  Patient presents with  . Hypertension    pt here for 6 month follow up    HPI  HTN: she has been taking medications as prescribed, no side effects. She gets bp checked at work, and is around 120's/80's No chest pain or palpitation. She is now exercising more.   Morbid Obesity: she has been obese all her life. Since May 2017 she has been working at [employer] and has changed her diet, for protein shakes for breakfast and lunch , eating a regular meal at dinner, and also exercising ( walking on a treadmill ) 5 times weekly and she has lost 28 lbs since I saw her in Feb. She states she is down on size in her clothing, feeling more energetic and is proud of herself.   Acanthosis Nigricans: she did not have labs done. She has polydipsia, but denies polyuria or polyphagia. Last hgbA1C was at goal  AR: under control at this time, takes medications prn. She usually has itchy eyes.    Patient Active Problem List   Diagnosis Date Noted  . Lymphedema 08/04/2014  . Acanthosis nigricans 08/02/2014  . Benign essential HTN 08/02/2014  . Allergic contact dermatitis 08/02/2014  . Extreme obesity ([place]) 08/02/2014  . Infrequent menses 08/02/2014  . Bilateral polycystic ovarian syndrome 08/02/2014  . Allergic rhinitis, seasonal 08/02/2014  . Vitamin D deficiency 10/12/2007    No past surgical history on file.  Family History  Problem Relation Age of Onset  . Diabetes Paternal Uncle   . Diabetes Paternal Grandfather     Social History   Social History  . Marital status: Single    Spouse name: N/A  . Number of children: N/A  . Years of education: N/A   Occupational History  . Not on file.   Social History Main Topics  . Smoking status: Never Smoker  . Smokeless tobacco: Never Used  . Alcohol use No  . Drug use: No  . Sexual activity: No    Other Topics Concern  . Not on file   Social History Narrative  . No narrative on file     Current Outpatient Prescriptions:  .  atenolol-chlorthalidone (TENORETIC 50) 50-25 MG tablet, Take 1 tablet by mouth daily., Disp: 30 tablet, Rfl: 5 .  cholecalciferol (VITAMIN D) 1000 UNITS tablet, Take 1,000 Units by mouth daily., Disp: , Rfl:   Allergies  Allergen Reactions  . Penicillins      ROS  Constitutional: Negative for fever, positive for  weight change.  Respiratory: Negative for cough and shortness of breath.   Cardiovascular: Negative for chest pain or palpitations.  Gastrointestinal: Negative for abdominal pain, no bowel changes.  Musculoskeletal: Negative for gait problem or joint swelling.  Skin: Negative for rash.  Neurological: Negative for dizziness or headache.  No other specific complaints in a complete review of systems (except as listed in HPI above).  Objective  Vitals:   09/29/15 1056  BP: 124/78  Pulse: 95  Resp: 18  Temp: 98.9 F (37.2 C)  SpO2: 96%  Weight: (!) 398 lb 2 oz (180.6 kg)  Height: 5\' 7"  (1.702 m)    Body mass index is 62.36 kg/m.  Physical Exam  Constitutional: Patient appears well-developed and well-nourished. Obese No distress.  HEENT: head atraumatic, normocephalic, pupils equal and reactive to  light,  neck supple, throat within normal limits Cardiovascular: Normal rate, regular rhythm and normal heart sounds.  No murmur heard. No BLE edema. Pulmonary/Chest: Effort normal and breath sounds normal. No respiratory distress. Abdominal: Soft.  There is no tenderness. Psychiatric: Patient has a normal mood and affect. behavior is normal. Judgment and thought content normal.   PHQ2/9: Depression screen [facility] 2/9 09/29/2015 03/27/2015 08/04/2014  Decreased Interest 0 0 0  Down, Depressed, Hopeless 0 0 0  PHQ - 2 Score 0 0 0    Fall Risk: Fall Risk  09/29/2015 03/27/2015 08/04/2014  Falls in the past year? No No No      Functional Status Survey: Is the patient deaf or have difficulty hearing?: No Does the patient have difficulty seeing, even when wearing glasses/contacts?: No Does the patient have difficulty concentrating, remembering, or making decisions?: No Does the patient have difficulty walking or climbing stairs?: No Does the patient have difficulty dressing or bathing?: No Does the patient have difficulty doing errands alone such as visiting a doctor's office or shopping?: No    Assessment & Plan  1. Benign essential HTN  - atenolol-chlorthalidone (TENORETIC 50) 50-25 MG tablet; Take 1 tablet by mouth daily.  Dispense: 30 tablet; Refill: 5 - Comprehensive metabolic panel  2. Morbid obesity, unspecified obesity type ([place])  Doing great, continue the hard work, she does not want medications or surgery at this time  3. Vitamin D deficiency  - VITAMIN D 25 Hydroxy (Vit-D Deficiency, Fractures)  4. Acanthosis nigricans  Recheck labs yearly   5. Thrombocytosis (HCC)  - CBC with Differential/Platelet

## 2015-10-13 MED FILL — ATENOLOL/CHLORTHAL 50/25: 50-25 | 30 days supply | Qty: 30 | Fill #0

## 2015-11-12 MED FILL — ATENOLOL/CHLORTHAL 50/25: 50-25 | 30 days supply | Qty: 30 | Fill #1

## 2015-12-15 MED FILL — ATENOLOL/CHLORTHAL 50/25: 50-25 | 30 days supply | Qty: 30 | Fill #2

## 2016-01-15 MED FILL — ATENOLOL/CHLORTHAL 50/25: 50-25 | 30 days supply | Qty: 30 | Fill #3

## 2016-02-10 ENCOUNTER — Telehealth: Payer: 59 | Admitting: Family

## 2016-02-10 DIAGNOSIS — J019 Acute sinusitis, unspecified: Secondary | ICD-10-CM

## 2016-02-10 MED ORDER — DOXYCYCLINE HYCLATE 100 MG PO TABS: 100.0000 mg | ORAL_TABLET | Freq: Two times a day (BID) | ORAL | 0 refills | Status: DC

## 2016-02-10 MED FILL — DOXYCYCLINE HYCLATE 100 MG: 100 | 10 days supply | Qty: 20 | Fill #0

## 2016-02-10 NOTE — Progress Notes (Signed)

## 2016-02-18 MED FILL — ATENOLOL-CHLORTHAL 50-25 TB: 50-25 | 30 days supply | Qty: 30 | Fill #4

## 2016-03-23 MED FILL — ATENOLOL/CHLORTHAL 50/25: 50-25 | 30 days supply | Qty: 30 | Fill #5

## 2016-04-05 ENCOUNTER — Encounter: Payer: 59 | Admitting: Family Medicine

## 2016-04-14 DIAGNOSIS — Z0120 Encounter for dental examination and cleaning without abnormal findings: Secondary | ICD-10-CM | POA: Diagnosis not present

## 2016-04-20 ENCOUNTER — Other Ambulatory Visit: Payer: Self-pay | Admitting: Family Medicine

## 2016-04-20 DIAGNOSIS — I10 Essential (primary) hypertension: Secondary | ICD-10-CM

## 2016-04-20 MED FILL — CIPROFLOXACIN HCL 500 MG TA: 500 | 1 days supply | Qty: 1 | Fill #0

## 2016-04-20 MED FILL — ATENOLOL/CHLORTHAL 50/25: 50-25 | 30 days supply | Qty: 30 | Fill #0

## 2016-05-24 MED FILL — ATENOLOL/CHLORTHAL 50/25: 50-25 | 30 days supply | Qty: 30 | Fill #1

## 2016-06-01 ENCOUNTER — Encounter: Payer: Self-pay | Admitting: Family Medicine

## 2016-06-01 ENCOUNTER — Other Ambulatory Visit: Payer: Self-pay | Admitting: Family Medicine

## 2016-06-01 ENCOUNTER — Ambulatory Visit (INDEPENDENT_AMBULATORY_CARE_PROVIDER_SITE_OTHER): Payer: 59 | Admitting: Family Medicine

## 2016-06-01 VITALS — BP 120/78 | HR 88 | Temp 98.5°F | Resp 18 | Ht 67.0 in | Wt 391.6 lb

## 2016-06-01 DIAGNOSIS — D473 Essential (hemorrhagic) thrombocythemia: Secondary | ICD-10-CM | POA: Diagnosis not present

## 2016-06-01 DIAGNOSIS — I10 Essential (primary) hypertension: Secondary | ICD-10-CM

## 2016-06-01 DIAGNOSIS — E8881 Metabolic syndrome: Secondary | ICD-10-CM

## 2016-06-01 DIAGNOSIS — Z13220 Encounter for screening for lipoid disorders: Secondary | ICD-10-CM

## 2016-06-01 DIAGNOSIS — D75839 Thrombocytosis, unspecified: Secondary | ICD-10-CM

## 2016-06-01 DIAGNOSIS — Z131 Encounter for screening for diabetes mellitus: Secondary | ICD-10-CM

## 2016-06-01 DIAGNOSIS — Z124 Encounter for screening for malignant neoplasm of cervix: Secondary | ICD-10-CM

## 2016-06-01 DIAGNOSIS — E559 Vitamin D deficiency, unspecified: Secondary | ICD-10-CM | POA: Diagnosis not present

## 2016-06-01 DIAGNOSIS — I890 Lymphedema, not elsewhere classified: Secondary | ICD-10-CM

## 2016-06-01 DIAGNOSIS — Z01419 Encounter for gynecological examination (general) (routine) without abnormal findings: Secondary | ICD-10-CM

## 2016-06-01 MED ORDER — ATENOLOL-CHLORTHALIDONE 50-25 MG PO TABS: 1.0000 | ORAL_TABLET | Freq: Every day | ORAL | 1 refills | Status: DC

## 2016-06-01 MED ORDER — LIRAGLUTIDE -WEIGHT MANAGEMENT 18 MG/3ML ~~LOC~~ SOPN: 0.6000 mg | PEN_INJECTOR | Freq: Every day | SUBCUTANEOUS | 2 refills | Status: DC

## 2016-06-01 MED ORDER — INSULIN PEN NEEDLE 30G X 8 MM MISC: 1.0000 | Freq: Every day | 0 refills | Status: DC

## 2016-06-01 NOTE — Progress Notes (Signed)
Name: Kimberly Cannon   MRN: [id number]    DOB: [birth year]   Date:06/01/2016       Progress Note  Subjective  Chief Complaint  Chief Complaint  Patient presents with  . Annual Exam  . Hypertension  . Obesity    HPI   Well Woman: she has PCOS and cycles are irregular, last one FEb 2018, usually lasts 3-4 days, but sometimes longer, no breast lumps. Never had pap smear and never sexually active. No vaginal discharge or bladder problems.   HTN: she has been taking medications as prescribed, no side effects. She gets bp checked at work, and is around 120's/80's No chest pain or palpitation. She denies dizziness.   Morbid Obesity: she has been obese all her life. Since May 2017 she has been working at [employer] and has changed her diet, for protein shakes for breakfast and lunch , eating a regular meal at dinner, she started to exercise FEb 2017 at work, and had lost down to 378, but gained some weight back since she stopped. She will try to resume physical activity. She is willing to add medication to the regiment  Acanthosis Nigricans: she will have labs done. She has polydipsia, but denies polyuria or polyphagia. She has PCOS and other features of insulin resistance such as increase in abdominal girth and hypertension.   Patient Active Problem List   Diagnosis Date Noted  . Lymphedema 08/04/2014  . Acanthosis nigricans 08/02/2014  . Benign essential HTN 08/02/2014  . Allergic contact dermatitis 08/02/2014  . Extreme obesity 08/02/2014  . Infrequent menses 08/02/2014  . Bilateral polycystic ovarian syndrome 08/02/2014  . Allergic rhinitis, seasonal 08/02/2014  . Vitamin D deficiency 10/12/2007    No past surgical history on file.  Family History  Problem Relation Age of Onset  . Diabetes Paternal Uncle   . Diabetes Paternal Grandfather     Social History   Social History  . Marital status: Single    Spouse name: N/A  . Number of children: N/A  . Years of education: N/A    Occupational History  . 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 Smoking status: Never Smoker  . Smokeless tobacco: Never Used  . Alcohol use No  . Drug use: No  . Sexual activity: No   Other Topics Concern  . Not on file   Social History Narrative   Lives with her mother and two younger sisters     Current Outpatient Prescriptions:  .  atenolol-chlorthalidone (TENORETIC) 50-25 MG tablet, Take 1 tablet by mouth daily., Disp: 90 tablet, Rfl: 1 .  cholecalciferol (VITAMIN D) 1000 UNITS tablet, Take 1,000 Units by mouth daily., Disp: , Rfl:  .  Liraglutide -Weight Management (SAXENDA) 18 MG/3ML SOPN, Inject 0.6-2.4 mg into the skin daily., Disp: 12 mL, Rfl: 2  Allergies  Allergen Reactions  . Penicillins      ROS  Constitutional: Negative for fever or significant  weight change.  Respiratory: Negative for cough and shortness of breath.   Cardiovascular: Negative for chest pain or palpitations.  Gastrointestinal: Negative for abdominal pain, no bowel changes.  Musculoskeletal: Negative for gait problem or joint swelling.  Skin: Negative for rash.  Neurological: Negative for dizziness or headache.  No other specific complaints in a complete review of systems (except as listed in HPI above).  Objective  Vitals:   06/01/16 1407  BP: 120/78  Pulse: 88  Resp: 18  Temp: 98.5 F (36.9  C)  SpO2: 98%  Weight: (!) 391 lb 9 oz (177.6 kg)  Height: 5\' 7"  (1.702 m)    Body mass index is 61.33 kg/m.  Physical Exam  Constitutional: Patient appears well-developed and well-nourished. No distress.  HENT: Head: Normocephalic and atraumatic. Ears: B TMs ok, no erythema or effusion; Nose: Nose normal. Mouth/Throat: Oropharynx is clear and moist. No oropharyngeal exudate.  Eyes: Conjunctivae and EOM are normal. Pupils are equal, round, and reactive to light. No scleral icterus.  Neck: Normal range of motion. Neck supple. No JVD present. No thyromegaly  present.  Cardiovascular: Normal rate, regular rhythm and normal heart sounds.  No murmur heard. No BLE edema. Pulmonary/Chest: Effort normal and breath sounds normal. No respiratory distress. Abdominal: Soft. Bowel sounds are normal, no distension. There is no tenderness. no masses Breast: no lumps or masses, no nipple discharge or rashes FEMALE GENITALIA:  External genitalia normal External urethra normal Vaginal vault normal without discharge or lesions Cervix not seen, patient tolerated procedure well, but speculum not long enough, it was a blind collection  Bimanual exam normal without masses RECTAL: not done Musculoskeletal: Normal range of motion, no joint effusions. No gross deformities Neurological: he is alert and oriented to person, place, and time. No cranial nerve deficit. Coordination, balance, strength, speech and gait are normal.  Skin: Skin is warm and dry. Acanthosis nigricans. No erythema.  Psychiatric: Patient has a normal mood and affect. behavior is normal. Judgment and thought content normal.  PHQ2/9: Depression screen [facility] 2/9 06/01/2016 09/29/2015 03/27/2015 08/04/2014  Decreased Interest 0 0 0 0  Down, Depressed, Hopeless 0 0 0 0  PHQ - 2 Score 0 0 0 0     Fall Risk: Fall Risk  06/01/2016 09/29/2015 03/27/2015 08/04/2014  Falls in the past year? No No No No    Assessment & Plan  1. Well woman exam  Discussed importance of 150 minutes of physical activity weekly, eat two servings of fish weekly, eat one serving of tree nuts ( cashews, pistachios, pecans, almonds.[name]) every other day, eat 6 servings of fruit/vegetables daily and drink plenty of water and avoid sweet beverages.   2. Benign essential HTN  - atenolol-chlorthalidone (TENORETIC) 50-25 MG tablet; Take 1 tablet by mouth daily.  Dispense: 90 tablet; Refill: 1 - COMPLETE METABOLIC PANEL WITH GFR - CBC with Differential/Platelet  3. Morbid obesity, unspecified obesity type ([place])  - Insulin,  fasting  4. Vitamin D deficiency  Continue medication   5. Thrombocytosis (HCC)  - CBC with Differential/Platelet  6. Extreme obesity  Discussed options, she is willing to try Saxenda , she denies family history of thyroid cancer or endocrine malignancies, no personal history of pancreatitis - Liraglutide -Weight Management (SAXENDA) 18 MG/3ML SOPN; Inject 0.6-2.4 mg into the skin daily.  Dispense: 12 mL; Refill: 2   7. Lymphedema  stable  8. Lipid screening  - Lipid panel  9. Diabetes mellitus screening  - Hemoglobin A1c  10. Screening for cervical cancer  - Pap,Surepath Rflx HPV   11. Insulin resistance  - Liraglutide -Weight Management (SAXENDA) 18 MG/3ML SOPN; Inject 0.6-2.4 mg into the skin daily.  Dispense: 12 mL; Refill: 2

## 2016-06-01 NOTE — Patient Instructions (Signed)
Preventive Care 18-39 Years, Female Preventive care refers to lifestyle choices and visits with your health care provider that can promote health and wellness. What does preventive care include?  A yearly physical exam. This is also called an annual well check.  Dental exams once or twice a year.  Routine eye exams. Ask your health care provider how often you should have your eyes checked.  Personal lifestyle choices, including:  Daily care of your teeth and gums.  Regular physical activity.  Eating a healthy diet.  Avoiding tobacco and drug use.  Limiting alcohol use.  Practicing safe sex.  Taking vitamin and mineral supplements as recommended by your health care provider. What happens during an annual well check? The services and screenings done by your health care provider during your annual well check will depend on your age, overall health, lifestyle risk factors, and family history of disease. Counseling  Your health care provider may ask you questions about your:  Alcohol use.  Tobacco use.  Drug use.  Emotional well-being.  Home and relationship well-being.  Sexual activity.  Eating habits.  Work and work environment.  Method of birth control.  Menstrual cycle.  Pregnancy history. Screening  You may have the following tests or measurements:  Height, weight, and BMI.  Diabetes screening. This is done by checking your blood sugar (glucose) after you have not eaten for a while (fasting).  Blood pressure.  Lipid and cholesterol levels. These may be checked every 5 years starting at age 20.  Skin check.  Hepatitis C blood test.  Hepatitis B blood test.  Sexually transmitted disease (STD) testing.  BRCA-related cancer screening. This may be done if you have a family history of breast, ovarian, tubal, or peritoneal cancers.  Pelvic exam and Pap test. This may be done every 3 years starting at age 21. Starting at age 30, this may be done every 5  years if you have a Pap test in combination with an HPV test. Discuss your test results, treatment options, and if necessary, the need for more tests with your health care provider. Vaccines  Your health care provider may recommend certain vaccines, such as:  Influenza vaccine. This is recommended every year.  Tetanus, diphtheria, and acellular pertussis (Tdap, Td) vaccine. You may need a Td booster every 10 years.  Varicella vaccine. You may need this if you have not been vaccinated.  HPV vaccine. If you are 26 or younger, you may need three doses over 6 months.  Measles, mumps, and rubella (MMR) vaccine. You may need at least one dose of MMR. You may also need a second dose.  Pneumococcal 13-valent conjugate (PCV13) vaccine. You may need this if you have certain conditions and were not previously vaccinated.  Pneumococcal polysaccharide (PPSV23) vaccine. You may need one or two doses if you smoke cigarettes or if you have certain conditions.  Meningococcal vaccine. One dose is recommended if you are age 19-21 years and a first-year college student living in a residence hall, or if you have one of several medical conditions. You may also need additional booster doses.  Hepatitis A vaccine. You may need this if you have certain conditions or if you travel or work in places where you may be exposed to hepatitis A.  Hepatitis B vaccine. You may need this if you have certain conditions or if you travel or work in places where you may be exposed to hepatitis B.  Haemophilus influenzae type b (Hib) vaccine. You may need this   if you have certain risk factors. Talk to your health care provider about which screenings and vaccines you need and how often you need them. This information is not intended to replace advice given to you by your health care provider. Make sure you discuss any questions you have with your health care provider. Document Released: 03/29/2001 Document Revised: 10/21/2015  Document Reviewed: 12/02/2014 Elsevier Interactive Patient Education  2017 [employer].

## 2016-06-02 ENCOUNTER — Encounter: Payer: Self-pay | Admitting: Family Medicine

## 2016-06-02 LAB — PAP IG AND HPV HIGH-RISK: HPV DNA High Risk: NOT DETECTED

## 2016-06-09 ENCOUNTER — Other Ambulatory Visit: Payer: Self-pay | Admitting: Family Medicine

## 2016-06-09 DIAGNOSIS — Z13220 Encounter for screening for lipoid disorders: Secondary | ICD-10-CM | POA: Diagnosis not present

## 2016-06-09 DIAGNOSIS — I10 Essential (primary) hypertension: Secondary | ICD-10-CM | POA: Diagnosis not present

## 2016-06-09 DIAGNOSIS — Z131 Encounter for screening for diabetes mellitus: Secondary | ICD-10-CM | POA: Diagnosis not present

## 2016-06-09 DIAGNOSIS — Z124 Encounter for screening for malignant neoplasm of cervix: Secondary | ICD-10-CM | POA: Diagnosis not present

## 2016-06-09 DIAGNOSIS — D473 Essential (hemorrhagic) thrombocythemia: Secondary | ICD-10-CM | POA: Diagnosis not present

## 2016-06-09 LAB — CBC WITH DIFFERENTIAL/PLATELET
BASOS PCT: 0 %
Basophils Absolute: 0 cells/uL (ref 0–200)
EOS ABS: 124 {cells}/uL (ref 15–500)
Eosinophils Relative: 2 %
HEMATOCRIT: 40.4 % (ref 35.0–45.0)
HEMOGLOBIN: 13.4 g/dL (ref 11.7–15.5)
LYMPHS ABS: 3410 {cells}/uL (ref 850–3900)
LYMPHS PCT: 55 %
MCH: 28.4 pg (ref 27.0–33.0)
MCHC: 33.2 g/dL (ref 32.0–36.0)
MCV: 85.6 fL (ref 80.0–100.0)
MONO ABS: 434 {cells}/uL (ref 200–950)
MPV: 9.6 fL (ref 7.5–12.5)
Monocytes Relative: 7 %
Neutro Abs: 2232 cells/uL (ref 1500–7800)
Neutrophils Relative %: 36 %
Platelets: 336 10*3/uL (ref 140–400)
RBC: 4.72 MIL/uL (ref 3.80–5.10)
RDW: 14.4 % (ref 11.0–15.0)
WBC: 6.2 10*3/uL (ref 3.8–10.8)

## 2016-06-10 LAB — HEMOGLOBIN A1C
HEMOGLOBIN A1C: 5 % (ref <5.7)
Mean Plasma Glucose: 97 mg/dL

## 2016-06-10 LAB — LIPID PANEL
CHOL/HDL RATIO: 2.9 ratio (ref <5.0)
Cholesterol: 172 mg/dL (ref <200)
HDL: 59 mg/dL (ref >50)
LDL CALC: 102 mg/dL — AB (ref <100)
TRIGLYCERIDES: 54 mg/dL (ref <150)
VLDL: 11 mg/dL (ref <30)

## 2016-06-10 LAB — COMPLETE METABOLIC PANEL WITH GFR
ALT: 26 U/L (ref 6–29)
AST: 21 U/L (ref 10–30)
Albumin: 4 g/dL (ref 3.6–5.1)
Alkaline Phosphatase: 91 U/L (ref 33–115)
BUN: 12 mg/dL (ref 7–25)
CHLORIDE: 101 mmol/L (ref 98–110)
CO2: 28 mmol/L (ref 20–31)
CREATININE: 0.54 mg/dL (ref 0.50–1.10)
Calcium: 9.5 mg/dL (ref 8.6–10.2)
GFR, Est Non African American: >89 mL/min (ref >=60)
Glucose, Bld: 77 mg/dL (ref 65–99)
POTASSIUM: 4.1 mmol/L (ref 3.5–5.3)
Sodium: 138 mmol/L (ref 135–146)
Total Bilirubin: 0.3 mg/dL (ref 0.2–1.2)
Total Protein: 7.9 g/dL (ref 6.1–8.1)

## 2016-06-10 LAB — INSULIN, FASTING: Insulin fasting, serum: 8.8 u[IU]/mL (ref 2.0–19.6)

## 2016-08-31 ENCOUNTER — Ambulatory Visit: Payer: 59 | Admitting: Family Medicine

## 2016-12-16 ENCOUNTER — Other Ambulatory Visit: Payer: Self-pay | Admitting: Family Medicine

## 2016-12-16 ENCOUNTER — Telehealth: Payer: Self-pay

## 2016-12-16 DIAGNOSIS — I10 Essential (primary) hypertension: Secondary | ICD-10-CM

## 2016-12-16 NOTE — Telephone Encounter (Signed)
Received a medication requested for Atenolol-Chlorthalidone. Patient will need to schedule an appointment due to last visit was on 06/01/16 and was a no show to her appointment on 08/31/16.

## 2016-12-16 NOTE — Telephone Encounter (Signed)
Left voice message for pt to return call to schedule an appointment.

## 2016-12-19 ENCOUNTER — Telehealth: Payer: Self-pay | Admitting: Family Medicine

## 2016-12-19 NOTE — Telephone Encounter (Signed)
Copied from [place] #[id number]. Topic: Inquiry >> Dec 19, 2016  6:28 PM Oliver Pila B wrote: Reason for CRM: PT called and asked to get her Rx of atenolol-chlorthalidone refilled she is needing that b/c she only has 5 pills left; she has an appt scheduled for the 15th this month

## 2016-12-20 ENCOUNTER — Other Ambulatory Visit: Payer: Self-pay

## 2016-12-20 DIAGNOSIS — I10 Essential (primary) hypertension: Secondary | ICD-10-CM

## 2016-12-20 MED ORDER — ATENOLOL-CHLORTHALIDONE 50-25 MG PO TABS: 1.0000 | ORAL_TABLET | Freq: Every day | ORAL | 0 refills | Status: DC

## 2016-12-20 NOTE — Telephone Encounter (Signed)
Refill request for Hypertension medication:  Atenolol-Chlorthalidone 50-25 mg  Last office visit pertaining to hypertension: 06/01/2016  BP Readings from Last 3 Encounters:  06/01/16 120/78  09/29/15 124/78  05/27/15 125/80     Lab Results  Component Value Date   CREATININE 0.54 06/09/2016   BUN 12 06/09/2016   NA 138 06/09/2016   K 4.1 06/09/2016   CL 101 06/09/2016   CO2 28 06/09/2016    Follow up visit: 12/29/2016

## 2016-12-21 ENCOUNTER — Other Ambulatory Visit: Payer: Self-pay | Admitting: Family Medicine

## 2016-12-21 DIAGNOSIS — I10 Essential (primary) hypertension: Secondary | ICD-10-CM

## 2016-12-21 NOTE — Telephone Encounter (Signed)
Prescription was sent yesterday and ready for patient to pick up. Called patient and left a voicemail to notify her of it.

## 2016-12-21 NOTE — Telephone Encounter (Signed)
Request for refill

## 2016-12-29 ENCOUNTER — Encounter: Payer: Self-pay | Admitting: Family Medicine

## 2016-12-29 ENCOUNTER — Ambulatory Visit (INDEPENDENT_AMBULATORY_CARE_PROVIDER_SITE_OTHER): Payer: 59 | Admitting: Family Medicine

## 2016-12-29 VITALS — BP 132/78 | HR 58 | Resp 14 | Ht 67.0 in | Wt >= 6400 oz

## 2016-12-29 DIAGNOSIS — I10 Essential (primary) hypertension: Secondary | ICD-10-CM | POA: Diagnosis not present

## 2016-12-29 DIAGNOSIS — E559 Vitamin D deficiency, unspecified: Secondary | ICD-10-CM | POA: Diagnosis not present

## 2016-12-29 DIAGNOSIS — F330 Major depressive disorder, recurrent, mild: Secondary | ICD-10-CM

## 2016-12-29 DIAGNOSIS — L83 Acanthosis nigricans: Secondary | ICD-10-CM | POA: Diagnosis not present

## 2016-12-29 DIAGNOSIS — M25562 Pain in left knee: Secondary | ICD-10-CM

## 2016-12-29 DIAGNOSIS — E8881 Metabolic syndrome: Secondary | ICD-10-CM

## 2016-12-29 MED ORDER — ATENOLOL-CHLORTHALIDONE 50-25 MG PO TABS: 1.0000 | ORAL_TABLET | Freq: Every day | ORAL | 1 refills | Status: DC

## 2016-12-29 MED ORDER — METFORMIN HCL ER 750 MG PO TB24: 750.0000 mg | ORAL_TABLET | Freq: Every day | ORAL | 0 refills | Status: DC

## 2016-12-29 MED ORDER — ESCITALOPRAM OXALATE 10 MG PO TABS: 10.0000 mg | ORAL_TABLET | Freq: Every day | ORAL | 1 refills | Status: DC

## 2016-12-29 NOTE — Progress Notes (Signed)
Name: Kimberly Cannon   MRN: [id number]    DOB: 01-24-1991   Date:12/29/2016       Progress Note  Subjective  Chief Complaint  Chief Complaint  Patient presents with  . Hypertension  . Obesity  . Depression    HPI  HTN: she has been taking medications as prescribed, no side effects. No chest pain or palpitation. She denies dizziness.   Morbid Obesity: she has been obese all her life. Since May 2017 she has been working at [employer] and has changed her diet, for protein shakes for breakfast and lunch , eating a regular meal at dinner, she started to exercise FEb 2017 at work, and had lost down to 378, but felt very depressed this past Summer worried about her mother and gained 31 lbs in the past 3 months. She states Saxenda not covered by insurance but should be on her formulary starting January 2019. She is willing to try Metformin ( discussed possible side effects) until that time to help with insulin resistance.   Acanthosis Nigricans: she will have labs done. She has polydipsia, but denies polyuria or polyphagia. She has PCOS and other features of insulin resistance such as increase in abdominal girth and hypertension.  Left anterior knee pain: she states that she fell when mother was hospitalized a couple of months ago she fell while walking to go to the bathroom while wearing socks. Pain is intermittent, like a catch, sharp at times no effusion, redness or increase in warmth. Frequency of pain has decreased, only triggered by squatting, twisting left knee. She will continue to monitor and if symptoms do not continue to improve she will call back for referral to Ortho   Depression: she has history of depression when she was teenager but never treated. Her step father went to prison early Summer, mother got depressed and had other medical problems and she felt very sad, increase in appetite and weight gain, lack of motivation and it affected her sleep ( difficulty falling asleep), she  states she is feeling a little better, but still not back to baseline and would like to try medication. Discussed counseling through work also. She denies suicidal thoughts or ideation.     Patient Active Problem List   Diagnosis Date Noted  . Lymphedema 08/04/2014  . Acanthosis nigricans 08/02/2014  . Benign essential HTN 08/02/2014  . Allergic contact dermatitis 08/02/2014  . Extreme obesity 08/02/2014  . Infrequent menses 08/02/2014  . Bilateral polycystic ovarian syndrome 08/02/2014  . Allergic rhinitis, seasonal 08/02/2014  . Vitamin D deficiency 10/12/2007    History reviewed. No pertinent surgical history.  Family History  Problem Relation Age of Onset  . Diabetes Paternal Uncle   . Diabetes Paternal Grandfather   . Cancer Paternal Grandmother     Social History   Socioeconomic History  . Marital status: Single    Spouse name: Not on file  . Number of children: Not on file  . Years of education: Not on file  . Highest education level: Not on file  Social Needs  . Financial resource strain: Not on file  . Food insecurity - worry: Not on file  . Food insecurity - inability: Not on file  . Transportation needs - medical: Not on file  . Transportation needs - non-medical: Not on file  Occupational History  . 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 Smoking status: Never Smoker  . Smokeless tobacco: Never Used  Substance and Sexual Activity  . Alcohol use: No    Alcohol/week: 0.0 oz  . Drug use: No  . Sexual activity: No  Other Topics Concern  . Not on file  Social History Narrative   Lives with her mother and two younger sisters     Current Outpatient Medications:  .  atenolol-chlorthalidone (TENORETIC) 50-25 MG tablet, Take 1 tablet daily by mouth., Disp: 90 tablet, Rfl: 1 .  cholecalciferol (VITAMIN D) 1000 UNITS tablet, Take 1,000 Units by mouth daily., Disp: , Rfl:  .  escitalopram (LEXAPRO) 10 MG tablet, Take 1  tablet (10 mg total) daily by mouth., Disp: 30 tablet, Rfl: 1 .  metFORMIN (GLUCOPHAGE-XR) 750 MG 24 hr tablet, Take 1 tablet (750 mg total) daily with breakfast by mouth., Disp: 90 tablet, Rfl: 0  Allergies  Allergen Reactions  . Penicillins      ROS  Constitutional: Negative for fever, positive  weight change.  Respiratory: Negative for cough and shortness of breath.   Cardiovascular: Negative for chest pain or palpitations.  Gastrointestinal: Negative for abdominal pain, no bowel changes.  Musculoskeletal: Negative for gait problem or joint swelling.  Skin: Negative for rash.  Neurological: Negative for dizziness or headache.  No other specific complaints in a complete review of systems (except as listed in HPI above).  Objective  Vitals:   12/29/16 1211  BP: 132/78  Pulse: (!) 58  Resp: 14  SpO2: 96%  Weight: (!) 421 lb 11.2 oz (191.3 kg)  Height: 5\' 7"  (1.702 m)    Body mass index is 66.05 kg/m.  Physical Exam  Constitutional: Patient appears well-developed and well-nourished. Morbid obese No distress.  HEENT: head atraumatic, normocephalic, pupils equal and reactive to light, neck supple, throat within normal limits Cardiovascular: Normal rate, regular rhythm and normal heart sounds.  No murmur heard. No BLE edema. Pulmonary/Chest: Effort normal and breath sounds normal. No respiratory distress. Abdominal: Soft.  There is no tenderness. Psychiatric: Patient has a normal mood and affect. behavior is normal. Judgment and thought content normal. Muscular Skeletal: knees hyperextends bilaterally, no effusion, normal exam  PHQ2/9: Depression screen [facility] 2/9 12/29/2016 12/29/2016 06/01/2016 09/29/2015 03/27/2015  Decreased Interest 1 1 0 0 0  Down, Depressed, Hopeless 1 1 0 0 0  PHQ - 2 Score 2 2 0 0 0  Altered sleeping 0 - - - -  Tired, decreased energy 1 - - - -  Change in appetite 2 - - - -  Feeling bad or failure about yourself  1 - - - -  Trouble concentrating 0  - - - -  Moving slowly or fidgety/restless 0 - - - -  Suicidal thoughts 0 - - - -  PHQ-9 Score 6 - - - -  Difficult doing work/chores Somewhat difficult - - - -     Fall Risk: Fall Risk  12/29/2016 06/01/2016 09/29/2015 03/27/2015 08/04/2014  Falls in the past year? No No No No No     Functional Status Survey: Is the patient deaf or have difficulty hearing?: No Does the patient have difficulty seeing, even when wearing glasses/contacts?: No Does the patient have difficulty concentrating, remembering, or making decisions?: No Does the patient have difficulty walking or climbing stairs?: No Does the patient have difficulty dressing or bathing?: No Does the patient have difficulty doing errands alone such as visiting a doctor's office or shopping?: No   Assessment & Plan  1. Benign essential HTN  - atenolol-chlorthalidone (TENORETIC) 50-25 MG tablet; Take  1 tablet daily by mouth.  Dispense: 90 tablet; Refill: 1  2. Morbid obesity, unspecified obesity type ([place])  We will try metformin, insurance is not covering saxenda, resume healthier diet and increase physical activity   3. Vitamin D deficiency  Take otc vitamin D   4. Insulin resistance  - metFORMIN (GLUCOPHAGE-XR) 750 MG 24 hr tablet; Take 1 tablet (750 mg total) daily with breakfast by mouth.  Dispense: 90 tablet; Refill: 0  5. Acanthosis nigricans   6. Depression, major, recurrent, mild (HCC)  - escitalopram (LEXAPRO) 10 MG tablet; Take 1 tablet (10 mg total) daily by mouth.  Dispense: 30 tablet; Refill  7. Acute pain of left knee  She will call back if she decides to see Ortho

## 2017-02-15 ENCOUNTER — Ambulatory Visit: Payer: 59 | Admitting: Family Medicine

## 2017-03-07 ENCOUNTER — Other Ambulatory Visit: Payer: Self-pay | Admitting: Family Medicine

## 2017-03-07 DIAGNOSIS — F330 Major depressive disorder, recurrent, mild: Secondary | ICD-10-CM

## 2017-03-07 NOTE — Telephone Encounter (Signed)
Refill request for general medication: Lexapro to [facility]   Last office visit: 12/29/2016   No Follow-up on file.

## 2017-03-17 ENCOUNTER — Encounter: Payer: Self-pay | Admitting: Family Medicine

## 2017-03-17 ENCOUNTER — Ambulatory Visit (INDEPENDENT_AMBULATORY_CARE_PROVIDER_SITE_OTHER): Payer: No Typology Code available for payment source | Admitting: Family Medicine

## 2017-03-17 VITALS — BP 126/84 | HR 85 | Temp 97.8°F | Resp 18 | Ht 67.0 in | Wt >= 6400 oz

## 2017-03-17 DIAGNOSIS — I10 Essential (primary) hypertension: Secondary | ICD-10-CM | POA: Diagnosis not present

## 2017-03-17 DIAGNOSIS — M25562 Pain in left knee: Secondary | ICD-10-CM | POA: Diagnosis not present

## 2017-03-17 DIAGNOSIS — E8881 Metabolic syndrome: Secondary | ICD-10-CM

## 2017-03-17 DIAGNOSIS — F330 Major depressive disorder, recurrent, mild: Secondary | ICD-10-CM

## 2017-03-17 DIAGNOSIS — L83 Acanthosis nigricans: Secondary | ICD-10-CM | POA: Diagnosis not present

## 2017-03-17 DIAGNOSIS — E668 Other obesity: Secondary | ICD-10-CM

## 2017-03-17 MED ORDER — DULAGLUTIDE 1.5 MG/0.5ML ~~LOC~~ SOAJ: 1.5000 mg | SUBCUTANEOUS | 1 refills | Status: DC

## 2017-03-17 MED ORDER — ESCITALOPRAM OXALATE 10 MG PO TABS: 10.0000 mg | ORAL_TABLET | Freq: Every day | ORAL | 0 refills | Status: DC

## 2017-03-17 MED ORDER — BUPROPION HCL ER (XL) 150 MG PO TB24: 150.0000 mg | ORAL_TABLET | Freq: Every day | ORAL | 0 refills | Status: DC

## 2017-03-17 NOTE — Progress Notes (Signed)
Name: Kimberly Cannon   MRN: [id number]    DOB: [birth year]   Date:03/17/2017       Progress Note  Subjective  Chief Complaint  Chief Complaint  Patient presents with  . Medication Refill  . Hypertension    Denies any symptoms  . Depression    Doing well   . Knee Pain    Left Knee Tries to avoid activites that make it hurt    HPI  HTN: she has been taking medications as prescribed, no side effects. No chest pain or palpitation.  Morbid Obesity: she has been obese all her life. Since May 2017 she has been working at [employer] and had changed her diet, for protein shakes for breakfast and lunch , eating a regular meal at dinner,she started to exercise FEb 2017 at work, and had lost down to 378, but felt very depressed the past Summer of 2018  worried about her mother and gained 31 lbs in 3 months and again another 13 lbs since Nov 2018. She is currently on Metformin for metabolic syndrome and is not working for her. She is willing to try injectables. She denies family history of thyroid cancer, or personal history of pancreatitis.   Acanthosis Nigricans:  She has polydipsia, but denies polyuria or polyphagia.She has PCOS and other features of insulin resistance such as increase in abdominal girth and hypertension.  Left anterior knee pain: she states that she fell when mother was hospitalized a couple of months ago she fell while walking to go to the bathroom while wearing socks. Pain is intermittent, like a catch, sharp at times no effusion, redness or increase in warmth. Frequency of pain has decreased, only triggered by squatting, twisting left knee. She states she has been avoiding activities that aggravate symptoms, she states pain is not as frequent, gradually going back to full activity , she still wants to hold off on referral to Ortho  Depression: she has history of depression when she was teenager but never treated. Her step father went to prison early Summer, mother got  depressed and had other medical problems and she felt very sad, increase in appetite and weight gain, lack of motivation and it affected her sleep ( difficulty falling asleep), she states she is feeling a little better, but still not back to baseline , we started her on Lexapro Nov 2018 and phq is unchanged, we will try adding welbutrin XL   Patient Active Problem List   Diagnosis Date Noted  . Left anterior knee pain 03/17/2017  . Lymphedema 08/04/2014  . Acanthosis nigricans 08/02/2014  . Benign essential HTN 08/02/2014  . Allergic contact dermatitis 08/02/2014  . Extreme obesity 08/02/2014  . Infrequent menses 08/02/2014  . Bilateral polycystic ovarian syndrome 08/02/2014  . Allergic rhinitis, seasonal 08/02/2014  . Vitamin D deficiency 10/12/2007    History reviewed. No pertinent surgical history.  Family History  Problem Relation Age of Onset  . Diabetes Paternal Uncle   . Diabetes Paternal Grandfather   . Cancer Paternal Grandmother     Social History   Socioeconomic History  . Marital status: Single    Spouse name: Not on file  . Number of children: Not on file  . Years of education: Not on file  . Highest education level: Not on file  Social Needs  . Financial resource strain: Not on file  . Food insecurity - worry: Not on file  . Food insecurity - inability: Not on file  . Transportation needs -  medical: Not on file  . Transportation needs - non-medical: Not on file  Occupational History  . 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 Smoking status: Never Smoker  . Smokeless tobacco: Never Used  Substance and Sexual Activity  . Alcohol use: No    Alcohol/week: 0.0 oz  . Drug use: No  . Sexual activity: No  Other Topics Concern  . Not on file  Social History Narrative   Lives with her mother and two younger sisters     Current Outpatient Medications:  .  atenolol-chlorthalidone (TENORETIC) 50-25 MG tablet, Take 1 tablet  daily by mouth., Disp: 90 tablet, Rfl: 1 .  cholecalciferol (VITAMIN D) 1000 UNITS tablet, Take 1,000 Units by mouth daily., Disp: , Rfl:  .  escitalopram (LEXAPRO) 10 MG tablet, TAKE 1 TABLET (10 MG TOTAL) DAILY BY MOUTH., Disp: 30 tablet, Rfl: 0 .  metFORMIN (GLUCOPHAGE-XR) 750 MG 24 hr tablet, Take 1 tablet (750 mg total) daily with breakfast by mouth., Disp: 90 tablet, Rfl: 0  Allergies  Allergen Reactions  . Penicillins      ROS  Constitutional: Negative for fever , positive for weight change.  Respiratory: Negative for cough and shortness of breath.   Cardiovascular: Negative for chest pain or palpitations.  Gastrointestinal: Negative for abdominal pain, no bowel changes.  Musculoskeletal: negative for gait problem or joint swelling.  Skin: Negative for rash.  Neurological: Negative for dizziness or headache.  No other specific complaints in a complete review of systems (except as listed in HPI above).  Objective  Vitals:   03/17/17 1553  BP: 126/84  Pulse: 85  Resp: 18  Temp: 97.8 F (36.6 C)  TempSrc: Oral  SpO2: 98%  Weight: (!) 434 lb 12.8 oz (197.2 kg)  Height: 5\' 7"  (1.702 m)    Body mass index is 68.1 kg/m.  Physical Exam  Constitutional: Patient appears well-developed and well-nourished. Obese No distress.  HEENT: head atraumatic, normocephalic, pupils equal and reactive to light,  neck supple, throat within normal limits Cardiovascular: Normal rate, regular rhythm and normal heart sounds.  No murmur heard. No BLE edema. Pulmonary/Chest: Effort normal and breath sounds normal. No respiratory distress. Abdominal: Soft.  There is no tenderness. Muscular Skeletal: no effusion, no pain during palpation or crepitus Psychiatric: Patient has a normal mood and affect. behavior is normal. Judgment and thought content normal.   PHQ2/9: Depression screen [facility] 2/9 03/17/2017 12/29/2016 12/29/2016 06/01/2016 09/29/2015  Decreased Interest 1 1 1  0 0  Down, Depressed,  Hopeless 1 1 1  0 0  PHQ - 2 Score 2 2 2  0 0  Altered sleeping 0 0 - - -  Tired, decreased energy 1 1 - - -  Change in appetite 2 2 - - -  Feeling bad or failure about yourself  1 1 - - -  Trouble concentrating 0 0 - - -  Moving slowly or fidgety/restless 0 0 - - -  Suicidal thoughts 0 0 - - -  PHQ-9 Score 6 6 - - -  Difficult doing work/chores Somewhat difficult Somewhat difficult - - -     Fall Risk: Fall Risk  03/17/2017 12/29/2016 06/01/2016 09/29/2015 03/27/2015  Falls in the past year? No No No No No     Functional Status Survey: Is the patient deaf or have difficulty hearing?: No Does the patient have difficulty seeing, even when wearing glasses/contacts?: No Does the patient have difficulty concentrating, remembering, or  making decisions?: No Does the patient have difficulty walking or climbing stairs?: No Does the patient have difficulty dressing or bathing?: No Does the patient have difficulty doing errands alone such as visiting a doctor's office or shopping?: No   Assessment & Plan  1. Acanthosis nigricans  Needs to eat and follow a diabetic diet  2. Extreme obesity  Discussed life style modification   3. Benign essential HTN  Continue medication  4. Left anterior knee pain  Doing better   5. Metabolic syndrome  - Dulaglutide (TRULICITY) 1.5 VO/5.3GU SOPN; Inject 1.5 mg into the skin once a week.  Dispense: 4 pen; Refill: 1  6. Depression, major, recurrent, mild (HCC)  - escitalopram (LEXAPRO) 10 MG tablet; Take 1 tablet (10 mg total) by mouth daily.  Dispense: 90 tablet; Refill: 0 - buPROPion (WELLBUTRIN XL) 150 MG 24 hr tablet; Take 1 tablet (150 mg total) by mouth daily.  Dispense: 90 tablet; Refill: 0  - escitalopram (LEXAPRO) 10 MG tablet; Take 1 tablet (10 mg total) by mouth daily.  Dispense: 90 tablet; Refill: 0 - buPROPion (WELLBUTRIN XL) 150 MG 24 hr tablet; Take 1 tablet (150 mg total) by mouth daily.  Dispense: 90 tablet; Refill: 0

## 2017-04-05 ENCOUNTER — Other Ambulatory Visit: Payer: Self-pay | Admitting: Family Medicine

## 2017-04-05 DIAGNOSIS — F330 Major depressive disorder, recurrent, mild: Secondary | ICD-10-CM

## 2017-04-25 ENCOUNTER — Ambulatory Visit: Payer: No Typology Code available for payment source | Admitting: Family Medicine

## 2017-05-16 ENCOUNTER — Encounter: Payer: Self-pay | Admitting: Family Medicine

## 2017-05-16 ENCOUNTER — Ambulatory Visit (INDEPENDENT_AMBULATORY_CARE_PROVIDER_SITE_OTHER): Payer: No Typology Code available for payment source | Admitting: Family Medicine

## 2017-05-16 DIAGNOSIS — I10 Essential (primary) hypertension: Secondary | ICD-10-CM

## 2017-05-16 DIAGNOSIS — J301 Allergic rhinitis due to pollen: Secondary | ICD-10-CM

## 2017-05-16 DIAGNOSIS — M25562 Pain in left knee: Secondary | ICD-10-CM

## 2017-05-16 DIAGNOSIS — F330 Major depressive disorder, recurrent, mild: Secondary | ICD-10-CM | POA: Diagnosis not present

## 2017-05-16 DIAGNOSIS — E8881 Metabolic syndrome: Secondary | ICD-10-CM | POA: Diagnosis not present

## 2017-05-16 MED ORDER — ATENOLOL-CHLORTHALIDONE 50-25 MG PO TABS: 1.0000 | ORAL_TABLET | Freq: Every day | ORAL | 1 refills | Status: DC

## 2017-05-16 MED ORDER — BUPROPION HCL ER (XL) 300 MG PO TB24: 300.0000 mg | ORAL_TABLET | Freq: Every day | ORAL | 1 refills | Status: DC

## 2017-05-16 MED ORDER — DULAGLUTIDE 1.5 MG/0.5ML ~~LOC~~ SOAJ: 1.5000 mg | SUBCUTANEOUS | 2 refills | Status: DC

## 2017-05-16 MED ORDER — ESCITALOPRAM OXALATE 10 MG PO TABS
10.0000 mg | ORAL_TABLET | Freq: Every day | ORAL | 0 refills | Status: DC
Start: 2017-05-16 — End: 2017-08-23

## 2017-05-16 MED ORDER — LORATADINE 10 MG PO TABS: 10.0000 mg | ORAL_TABLET | Freq: Every day | ORAL | 2 refills | Status: DC

## 2017-05-16 NOTE — Progress Notes (Signed)
Name: Kimberly Cannon   MRN: [id number]    DOB: Nov 16, 1990   Date:05/16/2017       Progress Note  Subjective  Chief Complaint  Chief Complaint  Patient presents with  . Follow-up    6 week F/U  . Depression    Started on Lexapro and Wellbutrin-has made her feel better and gave her more energy.  . Sore Throat    Onset-today, sore throat and dysphagia. Right ear pain. Has been around sick co-workers that had the Flu. Would like Dr. Ancil Boozer to check her ears and throat out during today's visit.    HPI  HTN: she has been taking medications as prescribed, no side effects. No chest pain, dizziness or palpitation.  Morbid Obesity: she has been obese all her life. Since May 2017 she has been working at [employer] and had changed her diet, for protein shakes for breakfast and lunch , eating a regular meal at dinner,she started to exercise FEb 2017 at work, and had lost down to 378, butfelt very depressed the past Summer of 2018  worried about her mother and gained 31 lbs in 3 months and again another 13 lbs from Nov 2018 till Feb 2019 . She was on Metformin for metabolic syndrome and was not working for her. We switched to Trulicity on 38/1829, she has lost 4 lbs since, but not a significant amount, discussed referral to weight loss clinic through [place] and she is willing to try.  Acanthosis Nigricans:  She has polydipsia, but denies polyuria or polyphagia.She has PCOS and other features of insulin resistance such as increase in abdominal girth and hypertension. She is currently on Trulicity  Left anterior knee pain: she states that she fell when mother was hospitalized a couple of months ago she fell while walking to go to the bathroom while wearing socks. Pain is intermittent, like a catch, sharp at times no effusion, redness or increase in warmth. Frequency of pain has decreased, only triggered by squatting, twisting left knee. She states she has been avoiding activities that aggravate  symptoms, she is willing to see Ortho now, I will place referral   Depression: she has history of depression when she was teenager but never treated. Her step father went to prison early Summer 2018  mother got depressed and had other medical problems and she felt very sad, increase in appetite and weight gain, lack of motivation and it affected her sleep ( difficulty falling asleep), she states she is feeling a little better, but still not back to baseline , we started her on Lexapro Nov 2018 and phq was unchanged, we added Wellbutrin Feb 2019 and she states feeling better and phq 9 has improved. She states thoughts are not negative and has more energy. She is willing to go up on dose of Wellbutrin today    Patient Active Problem List   Diagnosis Date Noted  . Left anterior knee pain 03/17/2017  . Lymphedema 08/04/2014  . Acanthosis nigricans 08/02/2014  . Benign essential HTN 08/02/2014  . Allergic contact dermatitis 08/02/2014  . Extreme obesity 08/02/2014  . Infrequent menses 08/02/2014  . Bilateral polycystic ovarian syndrome 08/02/2014  . Allergic rhinitis, seasonal 08/02/2014  . Vitamin D deficiency 10/12/2007    History reviewed. No pertinent surgical history.  Family History  Problem Relation Age of Onset  . Diabetes Paternal Uncle   . Diabetes Paternal Grandfather   . Cancer Paternal Grandmother     Social History   Socioeconomic History  .  Marital status: Single    Spouse name: Not on file  . Number of children: Not on file  . Years of education: Not on file  . Highest education level: Not on file  Occupational History  . 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 Financial resource strain: Not on file  . Food insecurity:    Worry: Not on file    Inability: Not on file  . Transportation needs:    Medical: Not on file    Non-medical: Not on file  Tobacco Use  . Smoking status: Never Smoker  . Smokeless tobacco: Never Used   Substance and Sexual Activity  . Alcohol use: No    Alcohol/week: 0.0 oz  . Drug use: No  . Sexual activity: Never  Lifestyle  . Physical activity:    Days per week: Not on file    Minutes per session: Not on file  . Stress: Not on file  Relationships  . Social connections:    Talks on phone: Not on file    Gets together: Not on file    Attends religious service: Not on file    Active member of club or organization: Not on file    Attends meetings of clubs or organizations: Not on file    Relationship status: Not on file  . Intimate partner violence:    Fear of current or ex partner: Not on file    Emotionally abused: Not on file    Physically abused: Not on file    Forced sexual activity: Not on file  Other Topics Concern  . Not on file  Social History Narrative   Lives with her mother and two younger sisters     Current Outpatient Medications:  .  atenolol-chlorthalidone (TENORETIC) 50-25 MG tablet, Take 1 tablet by mouth daily., Disp: 90 tablet, Rfl: 1 .  buPROPion (WELLBUTRIN XL) 300 MG 24 hr tablet, Take 1 tablet (300 mg total) by mouth daily., Disp: 90 tablet, Rfl: 1 .  cholecalciferol (VITAMIN D) 1000 UNITS tablet, Take 1,000 Units by mouth daily., Disp: , Rfl:  .  Dulaglutide (TRULICITY) 1.5 XB/2.8UX SOPN, Inject 1.5 mg into the skin once a week., Disp: 4 pen, Rfl: 2 .  escitalopram (LEXAPRO) 10 MG tablet, Take 1 tablet (10 mg total) by mouth daily., Disp: 90 tablet, Rfl: 0  Allergies  Allergen Reactions  . Penicillins      ROS  Constitutional: Negative for fever or significant  weight change.  Respiratory: Negative for cough and shortness of breath.   Cardiovascular: Negative for chest pain or palpitations.  Gastrointestinal: Negative for abdominal pain, no bowel changes.  Musculoskeletal: Negative for gait problem or joint swelling.  Skin: Negative for rash.  Neurological: Negative for dizziness or headache.  No other specific complaints in a complete  review of systems (except as listed in HPI above).  Objective  Vitals:   05/16/17 1553  BP: 130/74  Pulse: 84  Resp: 18  Temp: 98.4 F (36.9 C)  TempSrc: Oral  SpO2: 97%  Weight: (!) 430 lb (195 kg)  Height: 5\' 7"  (1.702 m)    Body mass index is 67.35 kg/m.  Physical Exam  Constitutional: Patient appears well-developed and well-nourished. Obese No distress.  HEENT: head atraumatic, normocephalic, pupils equal and reactive to light,neck supple, throat within normal limits, cobblestone formation posterior pharynx with mild erythema, some wax on left ear canal.  Cardiovascular: Normal rate, regular rhythm and normal  heart sounds.  No murmur heard. No pitting BLE edema. Pulmonary/Chest: Effort normal and breath sounds normal. No respiratory distress. Abdominal: Soft.  There is no tenderness. Psychiatric: Patient has a normal mood and affect. behavior is normal. Judgment and thought content normal. Muscular Skeletal: no effusion, redness or increase in warmth, normal gait   PHQ2/9: Depression screen [facility] 2/9 05/16/2017 03/17/2017 12/29/2016 12/29/2016 06/01/2016  Decreased Interest 1 1 1 1  0  Down, Depressed, Hopeless 0 1 1 1  0  PHQ - 2 Score 1 2 2 2  0  Altered sleeping 0 0 0 - -  Tired, decreased energy 1 1 1  - -  Change in appetite 1 2 2  - -  Feeling bad or failure about yourself  0 1 1 - -  Trouble concentrating 0 0 0 - -  Moving slowly or fidgety/restless 0 0 0 - -  Suicidal thoughts 0 0 0 - -  PHQ-9 Score 3 6 6  - -  Difficult doing work/chores Not difficult at all Somewhat difficult Somewhat difficult - -     Fall Risk: Fall Risk  05/16/2017 03/17/2017 12/29/2016 06/01/2016 09/29/2015  Falls in the past year? No No No No No      Assessment & Plan  1. Benign essential HTN  - atenolol-chlorthalidone (TENORETIC) 50-25 MG tablet; Take 1 tablet by mouth daily.  Dispense: 90 tablet; Refill: 1  2. Metabolic syndrome  - Dulaglutide (TRULICITY) 1.5 QI/6.9GE SOPN; Inject 1.5  mg into the skin once a week.  Dispense: 4 pen; Refill: 2  3. Depression, major, recurrent, mild (HCC)  - escitalopram (LEXAPRO) 10 MG tablet; Take 1 tablet (10 mg total) by mouth daily.  Dispense: 90 tablet; Refill: 0 - buPROPion (WELLBUTRIN XL) 300 MG 24 hr tablet; Take 1 tablet (300 mg total) by mouth daily.  Dispense: 90 tablet; Refill: 1  4. Morbid obesity ([place])  - Amb Ref to Medical Weight Management  5. Anterior knee pain, left  - Ambulatory referral to Orthopedic Surgery  6. Seasonal allergic rhinitis due to pollen  - loratadine (CLARITIN) 10 MG tablet; Take 1 tablet (10 mg total) by mouth daily.  Dispense: 30 tablet; Refill: 2

## 2017-06-12 ENCOUNTER — Encounter (INDEPENDENT_AMBULATORY_CARE_PROVIDER_SITE_OTHER): Payer: Self-pay | Admitting: Orthopaedic Surgery

## 2017-06-12 ENCOUNTER — Ambulatory Visit (INDEPENDENT_AMBULATORY_CARE_PROVIDER_SITE_OTHER): Payer: No Typology Code available for payment source

## 2017-06-12 ENCOUNTER — Ambulatory Visit (INDEPENDENT_AMBULATORY_CARE_PROVIDER_SITE_OTHER): Payer: No Typology Code available for payment source | Admitting: Orthopaedic Surgery

## 2017-06-12 VITALS — BP 140/93 | HR 73 | Resp 18 | Ht 68.0 in | Wt >= 6400 oz

## 2017-06-12 DIAGNOSIS — G8929 Other chronic pain: Secondary | ICD-10-CM

## 2017-06-12 DIAGNOSIS — M25562 Pain in left knee: Secondary | ICD-10-CM

## 2017-06-12 MED ORDER — METHYLPREDNISOLONE ACETATE 40 MG/ML IJ SUSP
80.0000 mg | INTRAMUSCULAR | Status: AC | PRN
  Administered 2017-06-12: 80 mg

## 2017-06-12 MED ORDER — LIDOCAINE HCL 1 % IJ SOLN
2.0000 mL | INTRAMUSCULAR | Status: AC | PRN
  Administered 2017-06-12: 2 mL

## 2017-06-12 MED ORDER — BUPIVACAINE HCL 0.5 % IJ SOLN
2.0000 mL | INTRAMUSCULAR | Status: AC | PRN
  Administered 2017-06-12: 2 mL via INTRA_ARTICULAR

## 2017-06-12 NOTE — Progress Notes (Signed)
Office Visit Note   Patient: Kimberly Cannon           Date of Birth: 1990-05-26           MRN: [id number] Visit Date: 06/12/2017              Requested by: Steele Sizer, [place], [place] [id number] PCP: Steele Sizer, MD   Assessment & Plan: Visit Diagnoses:  1. Chronic pain of left knee     Plan: Left anterior knee pain since she fell in 2018.  Things are consistent with chondromalacia patella.  Long discussion regarding exercises and cortisone injection.  She would like the injections of this was performed without difficulty.  Let us see her back as necessary.  Follow-Up Instructions: Return if symptoms worsen or fail to improve.   Orders:  Orders Placed This Encounter  Procedures  . XR KNEE 3 VIEW LEFT   No orders of the defined types were placed in this encounter.     Procedures: Large Joint Inj: L knee on 06/12/2017 2:46 PM Indications: pain and diagnostic evaluation Details: 25 G 1.5 in needle, anteromedial approach  Arthrogram: No  Medications: 2 mL lidocaine 1 %; 2 mL bupivacaine 0.5 %; 80 mg methylPREDNISolone acetate 40 MG/ML Procedure, treatment alternatives, risks and benefits explained, specific risks discussed. Consent was given by the patient. Patient was prepped and draped in the usual sterile fashion.       Clinical Data: No additional findings.   Subjective: Chief Complaint  Patient presents with  . Left Knee - Injury  . New Patient (Initial Visit)    pt fell aug 2018 and having knee pain but getting worse  Kimberly Cannon fell directly on the anterior aspect of her left knee in August 2018.  Since that time she has had some recurrent pain and difficulty maneuvering steps.  Seems to be localized along the anterior aspect of her knee with occasional discomfort laterally.  No obvious effusion.  No numbness or tingling, no hip pain or back discomfort.  No history of patella subluxation. HPI  Review of Systems    Constitutional: Negative for fatigue and fever.  HENT: Negative for ear pain.   Eyes: Negative for pain.  Respiratory: Negative for cough and shortness of breath.   Cardiovascular: Positive for leg swelling.  Gastrointestinal: Negative for constipation and diarrhea.  Genitourinary: Negative for difficulty urinating.  Musculoskeletal: Negative for back pain and neck pain.  Skin: Negative for rash.  Allergic/Immunologic: Negative for food allergies.  Neurological: Negative for weakness and numbness.  Hematological: Does not bruise/bleed easily.  Psychiatric/Behavioral: Negative for sleep disturbance.     Objective: Vital Signs: BP (!) 140/93 (BP Location: Left Arm, Patient Position: Sitting, Cuff Size: Normal)   Pulse 73   Resp 18   Ht 5\' 8"  (1.727 m)   Wt (!) 428 lb (194.1 kg)   BMI 65.08 kg/m   Physical Exam  Constitutional: She is oriented to person, place, and time. She appears well-developed and well-nourished.  HENT:  Mouth/Throat: Oropharynx is clear and moist.  Eyes: Pupils are equal, round, and reactive to light. EOM are normal.  Pulmonary/Chest: Effort normal.  Neurological: She is alert and oriented to person, place, and time.  Skin: Skin is warm and dry.  Psychiatric: She has a normal mood and affect. Her behavior is normal.    Ortho Exam awake alert and oriented x3.  Comfortable sitting.  Pain with patella Compression left knee.  Very large knees so it is difficult to tell if there is no effusion.  No significant medial lateral joint pain.  No instability.  No popliteal pain.  No calf discomfort.  Neurovascular exam intact.  Full extension about 90 to 95 degrees of flexion.  Skin intact  Specialty Comments:  No specialty comments available.  Imaging: Xr Knee 3 View Left  Result Date: 06/12/2017 Films of the left knee were obtained and standing projection in 3 views.  There is slight decrease in the medial joint space.  No ectopic calcification.  Recent valgus.   No acute changes.  No subchondral sclerosis or peripheral osteophytes no irregularity at the patellofemoral joint where patient is symptomatic    PMFS History: Patient Active Problem List   Diagnosis Date Noted  . Left anterior knee pain 03/17/2017  . Lymphedema 08/04/2014  . Acanthosis nigricans 08/02/2014  . Benign essential HTN 08/02/2014  . Allergic contact dermatitis 08/02/2014  . Extreme obesity 08/02/2014  . Infrequent menses 08/02/2014  . Bilateral polycystic ovarian syndrome 08/02/2014  . Allergic rhinitis, seasonal 08/02/2014  . Vitamin D deficiency 10/12/2007   Past Medical History:  Diagnosis Date  . Acanthosis nigricans   . Allergy   . Elevated serum protein level   . Hypertension   . Morbid obesity with BMI of 60.0-69.9, adult ([place])   . Oligomenorrhea   . PCOS (polycystic ovarian syndrome)   . Vitamin D deficiency     Family History  Problem Relation Age of Onset  . Diabetes Paternal Uncle   . Diabetes Paternal Grandfather   . Cancer Paternal Grandmother     History reviewed. No pertinent surgical history. Social History   Occupational History  . Occupation: Firefighter: [employer]  Tobacco Use  . Smoking status: Never Smoker  . Smokeless tobacco: Never Used  Substance and Sexual Activity  . Alcohol use: No    Alcohol/week: 0.0 oz  . Drug use: No  . Sexual activity: Never

## 2017-06-15 ENCOUNTER — Encounter (INDEPENDENT_AMBULATORY_CARE_PROVIDER_SITE_OTHER): Payer: No Typology Code available for payment source

## 2017-06-27 ENCOUNTER — Ambulatory Visit (INDEPENDENT_AMBULATORY_CARE_PROVIDER_SITE_OTHER): Payer: No Typology Code available for payment source | Admitting: Family Medicine

## 2017-06-27 ENCOUNTER — Encounter (INDEPENDENT_AMBULATORY_CARE_PROVIDER_SITE_OTHER): Payer: Self-pay | Admitting: Family Medicine

## 2017-06-27 VITALS — BP 121/82 | HR 66 | Temp 97.6°F | Ht 68.0 in | Wt >= 6400 oz

## 2017-06-27 DIAGNOSIS — R0602 Shortness of breath: Secondary | ICD-10-CM

## 2017-06-27 DIAGNOSIS — R5383 Other fatigue: Secondary | ICD-10-CM | POA: Diagnosis not present

## 2017-06-27 DIAGNOSIS — I10 Essential (primary) hypertension: Secondary | ICD-10-CM | POA: Diagnosis not present

## 2017-06-27 DIAGNOSIS — Z9189 Other specified personal risk factors, not elsewhere classified: Secondary | ICD-10-CM

## 2017-06-27 DIAGNOSIS — Z6841 Body Mass Index (BMI) 40.0 and over, adult: Secondary | ICD-10-CM | POA: Diagnosis not present

## 2017-06-27 DIAGNOSIS — Z1331 Encounter for screening for depression: Secondary | ICD-10-CM

## 2017-06-27 DIAGNOSIS — F3289 Other specified depressive episodes: Secondary | ICD-10-CM

## 2017-06-27 DIAGNOSIS — Z0289 Encounter for other administrative examinations: Secondary | ICD-10-CM

## 2017-06-28 LAB — COMPREHENSIVE METABOLIC PANEL
A/G RATIO: 1.2 (ref 1.2–2.2)
ALK PHOS: 128 IU/L — AB (ref 39–117)
ALT: 49 IU/L — AB (ref 0–32)
AST: 25 IU/L (ref 0–40)
Albumin: 4 g/dL (ref 3.5–5.5)
BUN/Creatinine Ratio: 21 (ref 9–23)
BUN: 13 mg/dL (ref 6–20)
Bilirubin Total: 0.3 mg/dL (ref 0.0–1.2)
CHLORIDE: 99 mmol/L (ref 96–106)
CO2: 26 mmol/L (ref 20–29)
Calcium: 9.5 mg/dL (ref 8.7–10.2)
Creatinine, Ser: 0.62 mg/dL (ref 0.57–1.00)
GFR calc Af Amer: 143 mL/min/{1.73_m2} (ref >59)
GFR calc non Af Amer: 124 mL/min/{1.73_m2} (ref >59)
GLUCOSE: 78 mg/dL (ref 65–99)
Globulin, Total: 3.3 g/dL (ref 1.5–4.5)
POTASSIUM: 4.3 mmol/L (ref 3.5–5.2)
Sodium: 138 mmol/L (ref 134–144)
TOTAL PROTEIN: 7.3 g/dL (ref 6.0–8.5)

## 2017-06-28 LAB — INSULIN, RANDOM: INSULIN: 20 u[IU]/mL (ref 2.6–24.9)

## 2017-06-28 LAB — CBC WITH DIFFERENTIAL
BASOS ABS: 0 10*3/uL (ref 0.0–0.2)
Basos: 0 %
EOS (ABSOLUTE): 0.1 10*3/uL (ref 0.0–0.4)
Eos: 2 %
Hematocrit: 40.4 % (ref 34.0–46.6)
Hemoglobin: 13.2 g/dL (ref 11.1–15.9)
IMMATURE GRANULOCYTES: 0 %
Immature Grans (Abs): 0 10*3/uL (ref 0.0–0.1)
LYMPHS ABS: 3.1 10*3/uL (ref 0.7–3.1)
Lymphs: 41 %
MCH: 28.6 pg (ref 26.6–33.0)
MCHC: 32.7 g/dL (ref 31.5–35.7)
MCV: 87 fL (ref 79–97)
Monocytes Absolute: 0.5 10*3/uL (ref 0.1–0.9)
Monocytes: 7 %
Neutrophils Absolute: 3.9 10*3/uL (ref 1.4–7.0)
Neutrophils: 50 %
RBC: 4.62 x10E6/uL (ref 3.77–5.28)
RDW: 14.7 % (ref 12.3–15.4)
WBC: 7.7 10*3/uL (ref 3.4–10.8)

## 2017-06-28 LAB — T4, FREE: Free T4: 1.03 ng/dL (ref 0.82–1.77)

## 2017-06-28 LAB — LIPID PANEL WITH LDL/HDL RATIO
Cholesterol, Total: 169 mg/dL (ref 100–199)
HDL: 60 mg/dL (ref >39)
LDL Calculated: 97 mg/dL (ref 0–99)
LDL/HDL RATIO: 1.6 ratio (ref 0.0–3.2)
Triglycerides: 61 mg/dL (ref 0–149)
VLDL Cholesterol Cal: 12 mg/dL (ref 5–40)

## 2017-06-28 LAB — VITAMIN D 25 HYDROXY (VIT D DEFICIENCY, FRACTURES): VIT D 25 HYDROXY: 24.7 ng/mL — AB (ref 30.0–100.0)

## 2017-06-28 LAB — T3: T3 TOTAL: 121 ng/dL (ref 71–180)

## 2017-06-28 LAB — FOLATE: FOLATE: 8.7 ng/mL (ref >3.0)

## 2017-06-28 LAB — VITAMIN B12: VITAMIN B 12: 827 pg/mL (ref 232–1245)

## 2017-06-28 LAB — HEMOGLOBIN A1C
Est. average glucose Bld gHb Est-mCnc: 103 mg/dL
HEMOGLOBIN A1C: 5.2 % (ref 4.8–5.6)

## 2017-06-28 LAB — TSH: TSH: 2.21 u[IU]/mL (ref 0.450–4.500)

## 2017-06-28 NOTE — Progress Notes (Signed)
.  Office: [phone]  /  Fax: [phone]   HPI:   Chief Complaint: OBESITY  Kimberly Cannon (MR# [id number]) is a 27 y.o. female who presents on 06/28/2017 for obesity evaluation and treatment. Current BMI is Body mass index is 64.62 kg/m.Kimberly Cannon Kimberly Cannon has struggled with obesity for years and has been unsuccessful in either losing weight or maintaining long term weight loss. Kimberly Cannon heard about our clinic through a coworker. Kimberly Cannon attended our information session and states she is currently in the action stage of change and ready to dedicate time achieving and maintaining a healthier weight.  Kimberly Cannon states her family eats meals together she thinks her family will eat healthier with  her her desired weight is less than 250 lbs she has been heavy most of  her life she started gaining weight in middle and high school her heaviest weight ever was 428 lbs. she has significant food cravings issues  she snacks frequently in the evenings she wakes up frequently in the middle of the night to eat she skips meals frequently she is frequently drinking liquids with calories she frequently makes poor food choices she has problems with excessive hunger  she frequently eats larger portions than normal  she has binge eating behaviors she struggles with emotional eating    Fatigue Kimberly Cannon feels her energy is lower than it should be. This has worsened with weight gain and has not worsened recently. Kimberly Cannon admits to daytime somnolence and  admits to waking up still tired. Patient is at risk for obstructive sleep apnea. Patent has a history of symptoms of daytime fatigue, morning fatigue and hypertension. Patient generally gets 6 or 7 hours of sleep per night, and states they generally have restful sleep. Snoring is not present. Apneic episodes are not present. Epworth Sleepiness Score is 6  EKG was ordered today and shows poor R wave progression and one T wave inversion.  Dyspnea on  exertion Kimberly Cannon notes increasing shortness of breath with exercising and seems to be worsening over time with weight gain. She notes getting out of breath sooner with activity than she used to. This has not gotten worse recently. EKG was ordered today and shows poor R wave progression and one T wave inversion. Kimberly Cannon denies orthopnea.  Hypertension Demaris L Cannon is a 27 y.o. female with hypertension and is on atenolol and chlorthalidone. Kimberly Cannon denies chest pain. She is working weight loss to help control her blood pressure with the goal of decreasing her risk of heart attack and stroke. Kimberly Cannon blood pressure is currently controlled.  At risk for cardiovascular disease Kimberly Cannon is at a higher than average risk for cardiovascular disease due to obesity and hypertension. She currently denies any chest pain.  Depression with emotional eating behaviors Kimberly Cannon hasn't seen a counselor or psychologist yet. Kimberly Cannon struggles with emotional eating and using food for comfort to the extent that it is negatively impacting her health. She often snacks when she is not hungry. Kimberly Cannon sometimes feels she is out of control and then feels guilty that she made poor food choices. She has been working on behavior modification techniques to help reduce her emotional eating and has been somewhat successful. She shows no sign of suicidal or homicidal ideations.  Depression screen [facility] 2/9 06/27/2017 05/16/2017 03/17/2017 12/29/2016 12/29/2016  Decreased Interest 1 1 1 1 1   Down, Depressed, Hopeless 1 0 1 1 1   PHQ - 2 Score 2 1 2 2 2   Altered sleeping 1 0 0 0 -  Tired, decreased energy 2 1 1 1  -  Change in appetite 1 1 2 2  -  Feeling bad or failure about yourself  2 0 1 1 -  Trouble concentrating 0 0 0 0 -  Moving slowly or fidgety/restless 0 0 0 0 -  Suicidal thoughts 0 0 0 0 -  PHQ-9 Score 8 3 6 6  -  Difficult doing work/chores Somewhat difficult Not difficult at all Somewhat difficult  Somewhat difficult -         Depression Screen Kimberly Cannon Food and Mood (modified PHQ-9) score was  Depression screen PHQ 2/9 06/27/2017  Decreased Interest 1  Down, Depressed, Hopeless 1  PHQ - 2 Score 2  Altered sleeping 1  Tired, decreased energy 2  Change in appetite 1  Feeling bad or failure about yourself  2  Trouble concentrating 0  Moving slowly or fidgety/restless 0  Suicidal thoughts 0  PHQ-9 Score 8  Difficult doing work/chores Somewhat difficult    ALLERGIES: Allergies  Allergen Reactions  . Penicillins     MEDICATIONS: Current Outpatient Medications on File Prior to Visit  Medication Sig Dispense Refill  . atenolol-chlorthalidone (TENORETIC) 50-25 MG tablet Take 1 tablet by mouth daily. 90 tablet 1  . buPROPion (WELLBUTRIN XL) 300 MG 24 hr tablet Take 1 tablet (300 mg total) by mouth daily. 90 tablet 1  . Dulaglutide (TRULICITY) 1.5 BS/9.6GE SOPN Inject 1.5 mg into the skin once a week. 4 pen 2  . escitalopram (LEXAPRO) 10 MG tablet Take 1 tablet (10 mg total) by mouth daily. 90 tablet 0   No current facility-administered medications on file prior to visit.     PAST MEDICAL HISTORY: Past Medical History:  Diagnosis Date  . Acanthosis nigricans   . Allergy   . Depression   . Elevated serum protein level   . Hypertension   . Knee pain    left  . Morbid obesity with BMI of 60.0-69.9, adult ([place])   . Oligomenorrhea   . PCOS (polycystic ovarian syndrome)   . Vitamin D deficiency     PAST SURGICAL HISTORY: History reviewed. No pertinent surgical history.  SOCIAL HISTORY: Social History   Tobacco Use  . Smoking status: Never Smoker  . Smokeless tobacco: Never Used  Substance Use Topics  . Alcohol use: No    Alcohol/week: 0.0 oz  . Drug use: No    FAMILY HISTORY: Family History  Problem Relation Age of Onset  . High blood pressure Mother   . Thyroid disease Mother   . Depression Mother   . Obesity Mother   . Obesity Father   . Sleep  apnea Father   . Diabetes Paternal Uncle   . Diabetes Paternal Grandfather   . Cancer Paternal Grandmother     ROS: Review of Systems  Constitutional: Positive for malaise/fatigue.  Respiratory: Positive for shortness of breath (on exertion).   Cardiovascular: Negative for chest pain and orthopnea.  Gastrointestinal: Positive for heartburn.  Skin: Positive for rash.  Psychiatric/Behavioral: Positive for depression. Negative for suicidal ideas.    PHYSICAL EXAM: Blood pressure 121/82, pulse 66, temperature 97.6 F (36.4 C), temperature source Oral, height 5\' 8"  (1.727 m), weight (!) 425 lb (192.8 kg), last menstrual period 06/22/2017, SpO2 95 %. Body mass index is 64.62 kg/m. Physical Exam  Constitutional: She is oriented to person, place, and time. She appears well-developed and well-nourished.  HENT:  Head: Normocephalic and atraumatic.  Nose: Nose normal.  Eyes: EOM are normal. No scleral  icterus.  Neck: Normal range of motion. Neck supple. No thyromegaly present.  Cardiovascular: Normal rate and regular rhythm.  Pulmonary/Chest: Effort normal. No respiratory distress.  Abdominal: Soft. There is no tenderness.  + obesity  Musculoskeletal: Normal range of motion.  Range of Motion normal in all 4 extremities  Neurological: She is alert and oriented to person, place, and time. Coordination normal.  Skin: Skin is warm and dry.  Psychiatric: She has a normal mood and affect. Her behavior is normal.  Vitals reviewed.   RECENT LABS AND TESTS: BMET    Component Value Date/Time   NA 138 06/27/2017 1159   NA 139 06/24/2013 0948   K 4.3 06/27/2017 1159   K 3.9 06/24/2013 0948   CL 99 06/27/2017 1159   CL 104 06/24/2013 0948   CO2 26 06/27/2017 1159   CO2 30 06/24/2013 0948   GLUCOSE 78 06/27/2017 1159   GLUCOSE 77 06/09/2016 1530   GLUCOSE 82 06/24/2013 0948   BUN 13 06/27/2017 1159   BUN 11 06/24/2013 0948   CREATININE 0.62 06/27/2017 1159   CREATININE 0.54  06/09/2016 1530   CALCIUM 9.5 06/27/2017 1159   CALCIUM 9.4 06/24/2013 0948   GFRNONAA 124 06/27/2017 1159   GFRNONAA >89 06/09/2016 1530   GFRAA 143 06/27/2017 1159   GFRAA >89 06/09/2016 1530   Lab Results  Component Value Date   HGBA1C 5.2 06/27/2017   Lab Results  Component Value Date   INSULIN 20.0 06/27/2017   CBC    Component Value Date/Time   WBC 7.7 06/27/2017 1159   WBC 6.2 06/09/2016 1530   RBC 4.62 06/27/2017 1159   RBC 4.72 06/09/2016 1530   HGB 13.2 06/27/2017 1159   HCT 40.4 06/27/2017 1159   PLT 336 06/09/2016 1530   PLT 385 (H) 03/27/2015 1136   MCV 87 06/27/2017 1159   MCV 86 06/24/2013 0948   MCH 28.6 06/27/2017 1159   MCH 28.4 06/09/2016 1530   MCHC 32.7 06/27/2017 1159   MCHC 33.2 06/09/2016 1530   RDW 14.7 06/27/2017 1159   RDW 13.7 06/24/2013 0948   LYMPHSABS 3.1 06/27/2017 1159   LYMPHSABS 2.2 06/24/2013 0948   MONOABS 434 06/09/2016 1530   MONOABS 0.6 06/24/2013 0948   EOSABS 0.1 06/27/2017 1159   EOSABS 0.1 06/24/2013 0948   BASOSABS 0.0 06/27/2017 1159   BASOSABS 0.0 06/24/2013 0948   Iron/TIBC/Ferritin/ %Sat No results found for: IRON, TIBC, FERRITIN, IRONPCTSAT Lipid Panel     Component Value Date/Time   CHOL 169 06/27/2017 1159   CHOL 173 06/24/2013 0948   TRIG 61 06/27/2017 1159   TRIG 45 06/24/2013 0948   HDL 60 06/27/2017 1159   HDL 54 06/24/2013 0948   CHOLHDL 2.9 06/09/2016 1530   VLDL 11 06/09/2016 1530   VLDL 9 06/24/2013 0948   LDLCALC 97 06/27/2017 1159   LDLCALC 110 (H) 06/24/2013 0948   Hepatic Function Panel     Component Value Date/Time   PROT 7.3 06/27/2017 1159   PROT 8.9 (H) 06/24/2013 0948   ALBUMIN 4.0 06/27/2017 1159   ALBUMIN 3.6 06/24/2013 0948   AST 25 06/27/2017 1159   AST 30 06/24/2013 0948   ALT 49 (H) 06/27/2017 1159   ALT 34 06/24/2013 0948   ALKPHOS 128 (H) 06/27/2017 1159   ALKPHOS 109 06/24/2013 0948   BILITOT 0.3 06/27/2017 1159   BILITOT 0.4 06/24/2013 0948      Component Value  Date/Time   TSH 2.210 06/27/2017 1159  Vitamin D Results for KYNADIE, YAUN (MRN [id number]) as of 06/28/2017 08:59  Ref. Range 03/27/2015 11:36  Vitamin D, 25-Hydroxy Latest Ref Range: 30.0 - 100.0 ng/mL 15.5 (L)    ECG  shows NSR with a rate of 64 BPM INDIRECT CALORIMETER done today shows a VO2 of 358 and a REE of 2495. Her calculated basal metabolic rate is 6144 thus her basal metabolic rate is worse than expected.    ASSESSMENT AND PLAN: Other fatigue - Plan: EKG 12-Lead, Insulin, random, Hemoglobin A1c, VITAMIN D 25 Hydroxy (Vit-D Deficiency, Fractures), Vitamin B12, Folate, T3, T4, free, TSH  Shortness of breath on exertion  Essential hypertension - Plan: Comprehensive metabolic panel, CBC With Differential, Insulin, random, Hemoglobin A1c, Lipid Panel With LDL/HDL Ratio  Other depression - with emotional eating   Depression screening  At risk for heart disease  Class 3 severe obesity with serious comorbidity and body mass index (BMI) of 60.0 to 69.9 in adult, unspecified obesity type (HCC)  PLAN:  Fatigue Oceane was informed that her fatigue may be related to obesity, depression or many other causes. Labs will be ordered, and in the meanwhile Shatasia has agreed to work on diet, exercise and weight loss to help with fatigue. Proper sleep hygiene was discussed including the need for 7-8 hours of quality sleep each night. A sleep study was not ordered based on symptoms and Epworth score. We will order indirect calorimetry.  Dyspnea on exertion Chastidy's shortness of breath appears to be obesity related and exercise induced. She has agreed to work on weight loss and gradually increase exercise to treat her exercise induced shortness of breath. If Sakeenah follows our instructions and loses weight without improvement of her shortness of breath, we will plan to refer to pulmonology. We will order labs and indirect calorimetry. We will monitor this condition regularly.  Lorilee agrees to this plan.  Hypertension We discussed sodium restriction, working on healthy weight loss, and a regular exercise program as the means to achieve improved blood pressure control. Gordie agreed with this plan and agreed to follow up as directed. We will order EKG and will continue to monitor her blood pressure as well as her progress with the above lifestyle modifications. She will continue her medications as prescribed and will watch for signs of hypotension as she continues her lifestyle modifications.   Cardiovascular risk counseling Soila was given extended (15 minutes) coronary artery disease prevention counseling today. She is 27 y.o. female and has risk factors for heart disease including obesity and hypertension. We discussed intensive lifestyle modifications today with an emphasis on specific weight loss instructions and strategies. Pt was also informed of the importance of increasing exercise and decreasing saturated fats to help prevent heart disease.  Depression with Emotional Eating Behaviors We discussed behavior modification techniques today to help Danaysha deal with her emotional eating and depression. We will refer to Dr. Mallie Mussel and she will continue her current medications as prescribed and follow up as directed.  Depression Screen Brelyn had a mildly positive depression screening. Depression is commonly associated with obesity and often results in emotional eating behaviors. We will monitor this closely and work on CBT to help improve the non-hunger eating patterns. Referral to Psychology may be required if no improvement is seen as she continues in our clinic. Alekhya will continue her medications as prescribed and we will refer to Dr. Mallie Mussel.  Obesity Javen is currently in the action stage of change and her goal is to continue with  weight loss efforts She has agreed to follow the Category 3 plan + 1 protein Naasia has been instructed to work up to  a goal of 150 minutes of combined cardio and strengthening exercise per week for weight loss and overall health benefits. We discussed the following Behavioral Modification Strategies today: planning for success, better snacking choices, increasing lean protein intake, increasing vegetables and work on meal planning and easy cooking plans  Innocence has agreed to follow up with our clinic in 2 weeks. She was informed of the importance of frequent follow up visits to maximize her success with intensive lifestyle modifications for her multiple health conditions. She was informed we would discuss her lab results at her next visit unless there is a critical issue that needs to be addressed sooner. Lauria agreed to keep her next visit at the agreed upon time to discuss these results.    OBESITY BEHAVIORAL INTERVENTION VISIT  Today's visit was # 1 out of 22.  Starting weight: 425 lbs Starting date: 06/27/17 Today's weight : 425 lbs  Today's date: 06/27/2017 Total lbs lost to date: 0 (Patients must lose 7 lbs in the first 6 months to continue with counseling)   ASK: We discussed the diagnosis of obesity with Anatasia L Barney today and Ariadna agreed to give [place] permission to discuss obesity behavioral modification therapy today.  ASSESS: Connor has the diagnosis of obesity and her BMI today is 64.64 Traniece is in the action stage of change   ADVISE: Elain was educated on the multiple health risks of obesity as well as the benefit of weight loss to improve her health. She was advised of the need for long term treatment and the importance of lifestyle modifications.  AGREE: Multiple dietary modification options and treatment options were discussed and  Karilynn agreed to the above obesity treatment plan.   I, Doreene Nest, am acting as transcriptionist for Eber Jones, MD   I have reviewed the above documentation for accuracy and completeness, and I agree with the above.  - Ilene Qua, MD

## 2017-07-03 ENCOUNTER — Ambulatory Visit (INDEPENDENT_AMBULATORY_CARE_PROVIDER_SITE_OTHER): Payer: No Typology Code available for payment source | Admitting: Family Medicine

## 2017-07-04 ENCOUNTER — Encounter (INDEPENDENT_AMBULATORY_CARE_PROVIDER_SITE_OTHER): Payer: Self-pay | Admitting: Family Medicine

## 2017-07-07 ENCOUNTER — Encounter: Payer: Self-pay | Admitting: Family Medicine

## 2017-07-08 ENCOUNTER — Other Ambulatory Visit: Payer: Self-pay | Admitting: Family Medicine

## 2017-07-08 DIAGNOSIS — F330 Major depressive disorder, recurrent, mild: Secondary | ICD-10-CM

## 2017-07-11 ENCOUNTER — Other Ambulatory Visit: Payer: Self-pay | Admitting: Family Medicine

## 2017-07-11 ENCOUNTER — Other Ambulatory Visit: Payer: Self-pay

## 2017-07-11 DIAGNOSIS — F330 Major depressive disorder, recurrent, mild: Secondary | ICD-10-CM

## 2017-07-11 NOTE — Telephone Encounter (Signed)
Refill request for general medication: Lexapro 10 mg  Last office visit: 05/16/2017  Last physical exam: 05/22/2016   Follow-ups on file. 08/23/2017

## 2017-07-12 ENCOUNTER — Ambulatory Visit (INDEPENDENT_AMBULATORY_CARE_PROVIDER_SITE_OTHER): Payer: No Typology Code available for payment source | Admitting: Family Medicine

## 2017-07-12 VITALS — BP 145/75 | HR 74 | Temp 97.9°F | Ht 68.0 in | Wt >= 6400 oz

## 2017-07-12 DIAGNOSIS — E8881 Metabolic syndrome: Secondary | ICD-10-CM | POA: Diagnosis not present

## 2017-07-12 DIAGNOSIS — E559 Vitamin D deficiency, unspecified: Secondary | ICD-10-CM

## 2017-07-12 DIAGNOSIS — I10 Essential (primary) hypertension: Secondary | ICD-10-CM | POA: Diagnosis not present

## 2017-07-12 DIAGNOSIS — E88819 Insulin resistance, unspecified: Secondary | ICD-10-CM

## 2017-07-12 DIAGNOSIS — Z6841 Body Mass Index (BMI) 40.0 and over, adult: Secondary | ICD-10-CM

## 2017-07-12 DIAGNOSIS — Z9189 Other specified personal risk factors, not elsewhere classified: Secondary | ICD-10-CM | POA: Diagnosis not present

## 2017-07-12 MED ORDER — VITAMIN D (ERGOCALCIFEROL) 1.25 MG (50000 UNIT) PO CAPS: 50000.0000 [IU] | ORAL_CAPSULE | ORAL | 0 refills | Status: DC

## 2017-07-13 NOTE — Progress Notes (Signed)
Office: [phone]  /  Fax: [phone]   HPI:   Chief Complaint: OBESITY Kimberly Cannon is here to discuss her progress with her obesity treatment plan. She is on the Category 3 plan + 1 protein snack and is following her eating plan approximately 100 % of the time. She states she is on the treadmill and weight lifting for 20-30 minutes 4-5 times per week. Kimberly Cannon is not really hungry except for a few nights. Significant increase in amount of food compared to what she was eating.  Her weight is (!) 420 lb (190.5 kg) today and has had a weight loss of 5 pounds over a period of 2 weeks since her last visit. She has lost 5 lbs since starting treatment with [place].  Insulin Resistance Kimberly Cannon has a diagnosis of insulin resistance based on her elevated fasting insulin level >5. Although Kimberly Cannon's blood glucose readings are still under good control, insulin resistance puts her at greater risk of metabolic syndrome and diabetes. She was previously on metformin but she reports she was taken off secondary to primary care physician not seeing improvement. She is not checking BGs. She continues to work on diet and exercise to decrease risk of diabetes.  At risk for diabetes Kimberly Cannon is at higher than average risk for developing diabetes due to her obesity and insulin resistance. She currently denies polyuria or polydipsia.  Vitamin D Deficiency Kimberly Cannon has a diagnosis of vitamin D deficiency. She is on OTC Vit D 5,000 IU supplement daily and denies nausea, vomiting or muscle weakness.  ALLERGIES: Allergies  Allergen Reactions  . Penicillins     MEDICATIONS: Current Outpatient Medications on File Prior to Visit  Medication Sig Dispense Refill  . atenolol-chlorthalidone (TENORETIC) 50-25 MG tablet Take 1 tablet by mouth daily. 90 tablet 1  . buPROPion (WELLBUTRIN XL) 300 MG 24 hr tablet Take 1 tablet (300 mg total) by mouth daily. 90 tablet 1  . Dulaglutide (TRULICITY) 1.5 IH/4.7QQ SOPN Inject 1.5  mg into the skin once a week. 4 pen 2  . escitalopram (LEXAPRO) 10 MG tablet Take 1 tablet (10 mg total) by mouth daily. 90 tablet 0   No current facility-administered medications on file prior to visit.     PAST MEDICAL HISTORY: Past Medical History:  Diagnosis Date  . Acanthosis nigricans   . Allergy   . Depression   . Elevated serum protein level   . Hypertension   . Knee pain    left  . Morbid obesity with BMI of 60.0-69.9, adult ([place])   . Oligomenorrhea   . PCOS (polycystic ovarian syndrome)   . Vitamin D deficiency     PAST SURGICAL HISTORY: No past surgical history on file.  SOCIAL HISTORY: Social History   Tobacco Use  . Smoking status: Never Smoker  . Smokeless tobacco: Never Used  Substance Use Topics  . Alcohol use: No    Alcohol/week: 0.0 oz  . Drug use: No    FAMILY HISTORY: Family History  Problem Relation Age of Onset  . High blood pressure Mother   . Thyroid disease Mother   . Depression Mother   . Obesity Mother   . Obesity Father   . Sleep apnea Father   . Diabetes Paternal Uncle   . Diabetes Paternal Grandfather   . Cancer Paternal Grandmother     ROS: Review of Systems  Constitutional: Positive for weight loss.  Gastrointestinal: Negative for nausea and vomiting.  Genitourinary: Negative for frequency.  Musculoskeletal:  Negative muscle weakness  Endo/Heme/Allergies: Negative for polydipsia.    PHYSICAL EXAM: Blood pressure (!) 145/75, pulse 74, temperature 97.9 F (36.6 C), temperature source Oral, height 5\' 8"  (1.727 m), weight (!) 420 lb (190.5 kg), last menstrual period 06/22/2017, SpO2 97 %. Body mass index is 63.86 kg/m. Physical Exam  Constitutional: She is oriented to person, place, and time. She appears well-developed and well-nourished.  Cardiovascular: Normal rate.  Pulmonary/Chest: Effort normal.  Musculoskeletal: Normal range of motion.  Neurological: She is oriented to person, place, and time.  Skin: Skin  is warm and dry.  Psychiatric: She has a normal mood and affect. Her behavior is normal.  Vitals reviewed.   RECENT LABS AND TESTS: BMET    Component Value Date/Time   NA 138 06/27/2017 1159   NA 139 06/24/2013 0948   K 4.3 06/27/2017 1159   K 3.9 06/24/2013 0948   CL 99 06/27/2017 1159   CL 104 06/24/2013 0948   CO2 26 06/27/2017 1159   CO2 30 06/24/2013 0948   GLUCOSE 78 06/27/2017 1159   GLUCOSE 77 06/09/2016 1530   GLUCOSE 82 06/24/2013 0948   BUN 13 06/27/2017 1159   BUN 11 06/24/2013 0948   CREATININE 0.62 06/27/2017 1159   CREATININE 0.54 06/09/2016 1530   CALCIUM 9.5 06/27/2017 1159   CALCIUM 9.4 06/24/2013 0948   GFRNONAA 124 06/27/2017 1159   GFRNONAA >89 06/09/2016 1530   GFRAA 143 06/27/2017 1159   GFRAA >89 06/09/2016 1530   Lab Results  Component Value Date   HGBA1C 5.2 06/27/2017   HGBA1C 5.0 06/09/2016   HGBA1C 5.5 03/27/2015   HGBA1C 5.5 06/24/2013   HGBA1C 5.5 06/21/2013   Lab Results  Component Value Date   INSULIN 20.0 06/27/2017   CBC    Component Value Date/Time   WBC 7.7 06/27/2017 1159   WBC 6.2 06/09/2016 1530   RBC 4.62 06/27/2017 1159   RBC 4.72 06/09/2016 1530   HGB 13.2 06/27/2017 1159   HCT 40.4 06/27/2017 1159   PLT 336 06/09/2016 1530   PLT 385 (H) 03/27/2015 1136   MCV 87 06/27/2017 1159   MCV 86 06/24/2013 0948   MCH 28.6 06/27/2017 1159   MCH 28.4 06/09/2016 1530   MCHC 32.7 06/27/2017 1159   MCHC 33.2 06/09/2016 1530   RDW 14.7 06/27/2017 1159   RDW 13.7 06/24/2013 0948   LYMPHSABS 3.1 06/27/2017 1159   LYMPHSABS 2.2 06/24/2013 0948   MONOABS 434 06/09/2016 1530   MONOABS 0.6 06/24/2013 0948   EOSABS 0.1 06/27/2017 1159   EOSABS 0.1 06/24/2013 0948   BASOSABS 0.0 06/27/2017 1159   BASOSABS 0.0 06/24/2013 0948   Iron/TIBC/Ferritin/ %Sat No results found for: IRON, TIBC, FERRITIN, IRONPCTSAT Lipid Panel     Component Value Date/Time   CHOL 169 06/27/2017 1159   CHOL 173 06/24/2013 0948   TRIG 61 06/27/2017  1159   TRIG 45 06/24/2013 0948   HDL 60 06/27/2017 1159   HDL 54 06/24/2013 0948   CHOLHDL 2.9 06/09/2016 1530   VLDL 11 06/09/2016 1530   VLDL 9 06/24/2013 0948   LDLCALC 97 06/27/2017 1159   LDLCALC 110 (H) 06/24/2013 0948   Hepatic Function Panel     Component Value Date/Time   PROT 7.3 06/27/2017 1159   PROT 8.9 (H) 06/24/2013 0948   ALBUMIN 4.0 06/27/2017 1159   ALBUMIN 3.6 06/24/2013 0948   AST 25 06/27/2017 1159   AST 30 06/24/2013 0948   ALT 49 (H) 06/27/2017 1159  ALT 34 06/24/2013 0948   ALKPHOS 128 (H) 06/27/2017 1159   ALKPHOS 109 06/24/2013 0948   BILITOT 0.3 06/27/2017 1159   BILITOT 0.4 06/24/2013 0948      Component Value Date/Time   TSH 2.210 06/27/2017 1159   TSH 2.800 03/27/2015 1136  Results for KEMARIA, DEDIC (MRN [id number]) as of 07/13/2017 11:00  Ref. Range 06/27/2017 11:59  Vitamin D, 25-Hydroxy Latest Ref Range: 30.0 - 100.0 ng/mL 24.7 (L)    ASSESSMENT AND PLAN: Vitamin D deficiency - Plan: Vitamin D, Ergocalciferol, (DRISDOL) 50000 units CAPS capsule  Insulin resistance  Essential hypertension  At risk for diabetes mellitus  Class 3 severe obesity with serious comorbidity and body mass index (BMI) of 60.0 to 69.9 in adult, unspecified obesity type ([place])  PLAN:  Insulin Resistance Kimberly Cannon will continue to work on weight loss, exercise, and decreasing simple carbohydrates in her diet to help decrease the risk of diabetes. We dicussed metformin including benefits and risks. She was informed that eating too many simple carbohydrates or too many calories at one sitting increases the likelihood of GI side effects. Secret declined metformin for now and prescription was not written today. Kimberly Cannon agrees to continue Trulicity, we will repeat labs in 3 months and Kimberly Cannon agrees to follow up with our clinic in 2 to 3 weeks as directed to monitor her progress.  Diabetes risk counselling Kimberly Cannon was given extended (30 minutes) diabetes  prevention counseling today. She is 27 y.o. female and has risk factors for diabetes including obesity and insulin resistance. We discussed intensive lifestyle modifications today with an emphasis on weight loss as well as increasing exercise and decreasing simple carbohydrates in her diet.  Vitamin D Deficiency Kimberly Cannon was informed that low vitamin D levels contributes to fatigue and are associated with obesity, breast, and colon cancer. Kimberly Cannon agrees to start prescription Vit D @50 ,000 IU every week #4 with no refills. She will follow up for routine testing of vitamin D, at least 2-3 times per year. She was informed of the risk of over-replacement of vitamin D and agrees to not increase her dose unless she discusses this with [place] first. We will retest in 3 months and Kimberly Cannon agrees to follow up with our clinic in 2 to 3 weeks.  Obesity Kimberly Cannon is currently in the action stage of change. As such, her goal is to continue with weight loss efforts She has agreed to follow the Category 3 plan Kimberly Cannon has been instructed to work up to a goal of 150 minutes of combined cardio and strengthening exercise per week for weight loss and overall health benefits. We discussed the following Behavioral Modification Strategies today: increasing lean protein intake, increasing vegetables, work on meal planning and easy cooking plans, and planning for success   Kimberly Cannon has agreed to follow up with our clinic in 2 to 3 weeks. She was informed of the importance of frequent follow up visits to maximize her success with intensive lifestyle modifications for her multiple health conditions.   OBESITY BEHAVIORAL INTERVENTION VISIT  Today's visit was # 2 out of 22.  Starting weight: 425 lbs Starting date: 06/27/17 Today's weight : 420 lbs  Today's date: 07/12/2017 Total lbs lost to date: 5 (Patients must lose 7 lbs in the first 6 months to continue with counseling)   ASK: We discussed the diagnosis of obesity  with Kimberly Cannon today and Kimberly Cannon agreed to give [place] permission to discuss obesity behavioral modification therapy today.  ASSESS: Kimberly Cannon has the  diagnosis of obesity and her BMI today is 63.88 [place] is in the action stage of change   ADVISE: Kimberly Cannon was educated on the multiple health risks of obesity as well as the benefit of weight loss to improve her health. She was advised of the need for long term treatment and the importance of lifestyle modifications.  AGREE: Multiple dietary modification options and treatment options were discussed and  Kimberly Cannon agreed to the above obesity treatment plan.  I, Kimberly Cannon, am acting as transcriptionist for Ilene Qua, MD  I have reviewed the above documentation for accuracy and completeness, and I agree with the above. - Ilene Qua, MD

## 2017-07-19 ENCOUNTER — Encounter (INDEPENDENT_AMBULATORY_CARE_PROVIDER_SITE_OTHER): Payer: Self-pay | Admitting: Family Medicine

## 2017-07-31 ENCOUNTER — Encounter: Payer: Self-pay | Admitting: Family Medicine

## 2017-08-02 ENCOUNTER — Ambulatory Visit (INDEPENDENT_AMBULATORY_CARE_PROVIDER_SITE_OTHER): Payer: No Typology Code available for payment source | Admitting: Family Medicine

## 2017-08-02 ENCOUNTER — Ambulatory Visit (INDEPENDENT_AMBULATORY_CARE_PROVIDER_SITE_OTHER): Payer: No Typology Code available for payment source | Admitting: Psychology

## 2017-08-15 ENCOUNTER — Ambulatory Visit (INDEPENDENT_AMBULATORY_CARE_PROVIDER_SITE_OTHER): Payer: No Typology Code available for payment source | Admitting: Family Medicine

## 2017-08-15 VITALS — BP 104/71 | HR 85 | Temp 98.5°F | Ht 68.0 in | Wt >= 6400 oz

## 2017-08-15 DIAGNOSIS — Z6841 Body Mass Index (BMI) 40.0 and over, adult: Secondary | ICD-10-CM | POA: Diagnosis not present

## 2017-08-15 DIAGNOSIS — I10 Essential (primary) hypertension: Secondary | ICD-10-CM

## 2017-08-15 DIAGNOSIS — Z9189 Other specified personal risk factors, not elsewhere classified: Secondary | ICD-10-CM | POA: Diagnosis not present

## 2017-08-15 DIAGNOSIS — E559 Vitamin D deficiency, unspecified: Secondary | ICD-10-CM | POA: Diagnosis not present

## 2017-08-15 DIAGNOSIS — E8881 Metabolic syndrome: Secondary | ICD-10-CM

## 2017-08-16 MED ORDER — VITAMIN D (ERGOCALCIFEROL) 1.25 MG (50000 UNIT) PO CAPS: 50000.0000 [IU] | ORAL_CAPSULE | ORAL | 0 refills | Status: DC

## 2017-08-16 NOTE — Progress Notes (Signed)
Office: [phone]  /  Fax: [phone]   HPI:   Chief Complaint: OBESITY Kimberly Cannon is here to discuss her progress with her obesity treatment plan. She is on the Category 3 plan and is following her eating plan approximately 90 % of the time. She states she is doing weight lifting and on the treadmill for 30 minutes 4 times per week. Kimberly Cannon has no hunger and still enjoying meal plan.  Her weight is (!) 417 lb (189.1 kg) today and has had a weight loss of 3 pounds over a period of 5 weeks since her last visit. She has lost 8 lbs since starting treatment with [place].  Insulin Resistance Kimberly Cannon has a diagnosis of insulin resistance based on her elevated fasting insulin level >5. Although T[place]'s blood glucose readings are still under good control, insulin resistance puts her at greater risk of metabolic syndrome and diabetes. She is on Trulicity, no GI upset or nausea and she denies hypoglycemia. She continues to work on diet and exercise to decrease risk of diabetes.  Vitamin D Deficiency Kimberly Cannon has a diagnosis of vitamin D deficiency. She is currently taking prescription Vit D. She notes fatigue and denies nausea, vomiting or muscle weakness.  At risk for osteopenia and osteoporosis Kimberly Cannon is at higher risk of osteopenia and osteoporosis due to vitamin D deficiency.   Hypertension Kimberly Cannon is a 26 y.o. female with hypertension. Kimberly Cannon's blood pressure is low today, she denies dizziness or lightheadedness. She is working weight loss to help control her blood pressure with the goal of decreasing her risk of heart attack and stroke. Kimberly Cannon's blood pressure is not currently controlled.  ALLERGIES: Allergies  Allergen Reactions  . Penicillins     MEDICATIONS: Current Outpatient Medications on File Prior to Visit  Medication Sig Dispense Refill  . atenolol-chlorthalidone (TENORETIC) 50-25 MG tablet Take 1 tablet by mouth daily. 90 tablet 1  . buPROPion (WELLBUTRIN  XL) 300 MG 24 hr tablet Take 1 tablet (300 mg total) by mouth daily. 90 tablet 1  . Dulaglutide (TRULICITY) 1.5 IN/8.6VE SOPN Inject 1.5 mg into the skin once a week. 4 pen 2  . escitalopram (LEXAPRO) 10 MG tablet Take 1 tablet (10 mg total) by mouth daily. 90 tablet 0  . Vitamin D, Ergocalciferol, (DRISDOL) 50000 units CAPS capsule Take 1 capsule (50,000 Units total) by mouth every 7 (seven) days. 4 capsule 0   No current facility-administered medications on file prior to visit.     PAST MEDICAL HISTORY: Past Medical History:  Diagnosis Date  . Acanthosis nigricans   . Allergy   . Depression   . Elevated serum protein level   . Hypertension   . Knee pain    left  . Morbid obesity with BMI of 60.0-69.9, adult ([place])   . Oligomenorrhea   . PCOS (polycystic ovarian syndrome)   . Vitamin D deficiency     PAST SURGICAL HISTORY: No past surgical history on file.  SOCIAL HISTORY: Social History   Tobacco Use  . Smoking status: Never Smoker  . Smokeless tobacco: Never Used  Substance Use Topics  . Alcohol use: No    Alcohol/week: 0.0 oz  . Drug use: No    FAMILY HISTORY: Family History  Problem Relation Age of Onset  . High blood pressure Mother   . Thyroid disease Mother   . Depression Mother   . Obesity Mother   . Obesity Father   . Sleep apnea Father   . Diabetes Paternal  Uncle   . Diabetes Paternal Grandfather   . Cancer Paternal Grandmother     ROS: Review of Systems  Constitutional: Positive for malaise/fatigue and weight loss.  Gastrointestinal: Negative for nausea and vomiting.  Musculoskeletal:       Negative muscle weakness  Neurological: Negative for dizziness.       Negative lightheadedness  Endo/Heme/Allergies:       Negative hypoglycemia    PHYSICAL EXAM: Blood pressure 104/71, pulse 85, temperature 98.5 F (36.9 C), temperature source Oral, height 5\' 8"  (1.727 m), weight (!) 417 lb (189.1 kg), SpO2 98 %. Body mass index is 63.4  kg/m. Physical Exam  Constitutional: She is oriented to person, place, and time. She appears well-developed and well-nourished.  Cardiovascular: Normal rate.  Pulmonary/Chest: Effort normal.  Musculoskeletal: Normal range of motion.  Neurological: She is oriented to person, place, and time.  Skin: Skin is warm and dry.  Psychiatric: She has a normal mood and affect. Her behavior is normal.  Vitals reviewed.   RECENT LABS AND TESTS: BMET    Component Value Date/Time   NA 138 06/27/2017 1159   NA 139 06/24/2013 0948   K 4.3 06/27/2017 1159   K 3.9 06/24/2013 0948   CL 99 06/27/2017 1159   CL 104 06/24/2013 0948   CO2 26 06/27/2017 1159   CO2 30 06/24/2013 0948   GLUCOSE 78 06/27/2017 1159   GLUCOSE 77 06/09/2016 1530   GLUCOSE 82 06/24/2013 0948   BUN 13 06/27/2017 1159   BUN 11 06/24/2013 0948   CREATININE 0.62 06/27/2017 1159   CREATININE 0.54 06/09/2016 1530   CALCIUM 9.5 06/27/2017 1159   CALCIUM 9.4 06/24/2013 0948   GFRNONAA 124 06/27/2017 1159   GFRNONAA >89 06/09/2016 1530   GFRAA 143 06/27/2017 1159   GFRAA >89 06/09/2016 1530   Lab Results  Component Value Date   HGBA1C 5.2 06/27/2017   HGBA1C 5.0 06/09/2016   HGBA1C 5.5 03/27/2015   HGBA1C 5.5 06/24/2013   HGBA1C 5.5 06/21/2013   Lab Results  Component Value Date   INSULIN 20.0 06/27/2017   CBC    Component Value Date/Time   WBC 7.7 06/27/2017 1159   WBC 6.2 06/09/2016 1530   RBC 4.62 06/27/2017 1159   RBC 4.72 06/09/2016 1530   HGB 13.2 06/27/2017 1159   HCT 40.4 06/27/2017 1159   PLT 336 06/09/2016 1530   PLT 385 (H) 03/27/2015 1136   MCV 87 06/27/2017 1159   MCV 86 06/24/2013 0948   MCH 28.6 06/27/2017 1159   MCH 28.4 06/09/2016 1530   MCHC 32.7 06/27/2017 1159   MCHC 33.2 06/09/2016 1530   RDW 14.7 06/27/2017 1159   RDW 13.7 06/24/2013 0948   LYMPHSABS 3.1 06/27/2017 1159   LYMPHSABS 2.2 06/24/2013 0948   MONOABS 434 06/09/2016 1530   MONOABS 0.6 06/24/2013 0948   EOSABS 0.1  06/27/2017 1159   EOSABS 0.1 06/24/2013 0948   BASOSABS 0.0 06/27/2017 1159   BASOSABS 0.0 06/24/2013 0948   Iron/TIBC/Ferritin/ %Sat No results found for: IRON, TIBC, FERRITIN, IRONPCTSAT Lipid Panel     Component Value Date/Time   CHOL 169 06/27/2017 1159   CHOL 173 06/24/2013 0948   TRIG 61 06/27/2017 1159   TRIG 45 06/24/2013 0948   HDL 60 06/27/2017 1159   HDL 54 06/24/2013 0948   CHOLHDL 2.9 06/09/2016 1530   VLDL 11 06/09/2016 1530   VLDL 9 06/24/2013 0948   LDLCALC 97 06/27/2017 1159   LDLCALC 110 (H) 06/24/2013  0948   Hepatic Function Panel     Component Value Date/Time   PROT 7.3 06/27/2017 1159   PROT 8.9 (H) 06/24/2013 0948   ALBUMIN 4.0 06/27/2017 1159   ALBUMIN 3.6 06/24/2013 0948   AST 25 06/27/2017 1159   AST 30 06/24/2013 0948   ALT 49 (H) 06/27/2017 1159   ALT 34 06/24/2013 0948   ALKPHOS 128 (H) 06/27/2017 1159   ALKPHOS 109 06/24/2013 0948   BILITOT 0.3 06/27/2017 1159   BILITOT 0.4 06/24/2013 0948      Component Value Date/Time   TSH 2.210 06/27/2017 1159   TSH 2.800 03/27/2015 1136  Results for COLIE, JOSTEN (MRN [id number]) as of 08/16/2017 07:39  Ref. Range 06/27/2017 11:59  Vitamin D, 25-Hydroxy Latest Ref Range: 30.0 - 100.0 ng/mL 24.7 (L)    ASSESSMENT AND PLAN: Insulin resistance  Vitamin D deficiency - Plan: Vitamin D, Ergocalciferol, (DRISDOL) 50000 units CAPS capsule  Essential hypertension  At risk for osteoporosis  Class 3 severe obesity with serious comorbidity and body mass index (BMI) of 60.0 to 69.9 in adult, unspecified obesity type ([facility])  PLAN:  Insulin Resistance Kimberly Cannon will continue to work on weight loss, exercise, and decreasing simple carbohydrates in her diet to help decrease the risk of diabetes. We dicussed metformin including benefits and risks. She was informed that eating too many simple carbohydrates or too many calories at one sitting increases the likelihood of GI side effects. Kimberly Cannon agrees to  continue Trulicity and she agrees to follow up with our clinic in 2 weeks as directed to monitor her progress.  Vitamin D Deficiency Kimberly Cannon was informed that low vitamin D levels contributes to fatigue and are associated with obesity, breast, and colon cancer. Kimberly Cannon agrees to continue taking prescription Vit D @50 ,000 IU every week #4 and we will refill for 1 month. She will follow up for routine testing of vitamin D, at least 2-3 times per year. She was informed of the risk of over-replacement of vitamin D and agrees to not increase her dose unless she discusses this with [place] first. Kimberly Cannon agrees to follow up with our clinic in 2 weeks.  At risk for osteopenia and osteoporosis Kimberly Cannon is at risk for osteopenia and osteoporsis due to her vitamin D deficiency. She was encouraged to take her vitamin D and follow her higher calcium diet and increase strengthening exercise to help strengthen her bones and decrease her risk of osteopenia and osteoporosis.  Hypertension We discussed sodium restriction, working on healthy weight loss, and a regular exercise program as the means to achieve improved blood pressure control. Kimberly Cannon agreed with this plan and agreed to follow up as directed. We will continue to monitor her blood pressure as well as her progress with the above lifestyle modifications. She will continue her medications as prescribed and will watch for signs of hypotension as she continues her lifestyle modifications. Kimberly Cannon agrees to follow up with our clinic in 2 weeks and we will follow up on blood pressure at that time.  Obesity Kimberly Cannon is currently in the action stage of change. As such, her goal is to continue with weight loss efforts She has agreed to follow the Category 3 plan with breakfast options Kimberly Cannon has been instructed to work up to a goal of 150 minutes of combined cardio and strengthening exercise per week for weight loss and overall health benefits. We discussed the  following Behavioral Modification Strategies today: increasing lean protein intake, increasing vegetables, work on meal planning and  easy cooking plans, celebration eating strategies, and planning for success   Kimberly Cannon has agreed to follow up with our clinic in 2 weeks. She was informed of the importance of frequent follow up visits to maximize her success with intensive lifestyle modifications for her multiple health conditions.   OBESITY BEHAVIORAL INTERVENTION VISIT  Today's visit was # 3 out of 22.  Starting weight: 425 lbs Starting date: 06/27/17 Today's weight : 417 lbs  Today's date: 08/15/2017 Total lbs lost to date: 8 (Patients must lose 7 lbs in the first 6 months to continue with counseling)   ASK: We discussed the diagnosis of obesity with Kimberly Cannon today and Kimberly Cannon agreed to give [place] permission to discuss obesity behavioral modification therapy today.  ASSESS: Kimberly Cannon has the diagnosis of obesity and her BMI today is 63.42 Kimberly Cannon is in the action stage of change   ADVISE: Deneice was educated on the multiple health risks of obesity as well as the benefit of weight loss to improve her health. She was advised of the need for long term treatment and the importance of lifestyle modifications.  AGREE: Multiple dietary modification options and treatment options were discussed and  Kimberly Cannon agreed to the above obesity treatment plan.  I, Trixie Dredge, am acting as transcriptionist for Ilene Qua, MD  I have reviewed the above documentation for accuracy and completeness, and I agree with the above. - Ilene Qua, MD

## 2017-08-23 ENCOUNTER — Encounter: Payer: Self-pay | Admitting: Family Medicine

## 2017-08-23 ENCOUNTER — Ambulatory Visit (INDEPENDENT_AMBULATORY_CARE_PROVIDER_SITE_OTHER): Payer: No Typology Code available for payment source | Admitting: Family Medicine

## 2017-08-23 DIAGNOSIS — F330 Major depressive disorder, recurrent, mild: Secondary | ICD-10-CM | POA: Diagnosis not present

## 2017-08-23 DIAGNOSIS — E8881 Metabolic syndrome: Secondary | ICD-10-CM | POA: Diagnosis not present

## 2017-08-23 DIAGNOSIS — I10 Essential (primary) hypertension: Secondary | ICD-10-CM | POA: Diagnosis not present

## 2017-08-23 DIAGNOSIS — D473 Essential (hemorrhagic) thrombocythemia: Secondary | ICD-10-CM | POA: Insufficient documentation

## 2017-08-23 DIAGNOSIS — D75839 Thrombocytosis, unspecified: Secondary | ICD-10-CM | POA: Insufficient documentation

## 2017-08-23 MED ORDER — DULAGLUTIDE 1.5 MG/0.5ML ~~LOC~~ SOAJ: 1.5000 mg | SUBCUTANEOUS | 1 refills | Status: DC

## 2017-08-23 MED ORDER — ESCITALOPRAM OXALATE 10 MG PO TABS: 10.0000 mg | ORAL_TABLET | Freq: Every day | ORAL | 1 refills | Status: DC

## 2017-08-23 NOTE — Progress Notes (Signed)
Name: Kimberly Cannon   MRN: [id number]    DOB: 06-Nov-1990   Date:08/23/2017       Progress Note  Subjective  Chief Complaint  Chief Complaint  Patient presents with  . Hypertension    patient does not check her blood pressure  . Obesity    patient is working on [facility] - under the care of Dr. Osborne Casco  . Knee Pain    patient stated that she has gotten a cortisone injection since last visit  . Depression    patient stated that the medication has been helping  . Medication Refill    HPI  HTN: she has been taking medications as prescribed, no side effects. No chest pain, dizziness or palpitation.BP is at goal today, losing weight and on her last visit with Dr. Adair Patter bp was low, we will continue to monitor for now  Morbid Obesity: she has been obese all her life. Since May 2017 she has been working at [employer] [employer] and [employer] her diet, for protein shakes for breakfast and lunch , eating a regular meal at dinner,she started to exercise FEb 2017 at work, and had lost down to 378, butfelt very depressed thepast Summer of 2018worried about her mother and gained 31 lbs in 3 months and again another 13 lbs from Nov 2018 till Feb 2019 .She was on Metformin for metabolic syndrome and was not working for her. We switched to Trulicity on 27/7412. She is now going to the health weight management clinic in [place] and has lost 10 lbs in the past 3 months  Doing well with diet plan and exercising   Acanthosis Nigricans: She has polydipsia, but denies polyuria or polyphagia.She has PCOS and other features of insulin resistance such as increase in abdominal girth and hypertension. She is currently on Trulicity. Unchanged   Left anterior knee pain: she states that she fell when mother was hospitalized a couple of months ago she fell while walking to go to the bathroom while wearing socks. Pain is intermittent, like a catch, sharp at times no effusion, redness or increase in warmth.  Frequency of pain has decreased, only triggered by squatting, twisting left knee.She saw Ortho since last visit and had steroid injection left knee and is feeling better since   Depression: she has history of depression when she was teenager but never treated. Her step father went to prison early Summer 2018  mother got depressed and had other medical problems and she felt very sad, increase in appetite and weight gain, lack of motivation and it affected her sleep ( difficulty falling asleep), she states she is feeling a little better, but still not back to baseline, we started her on Lexapro Nov 2018 and phq was unchanged, we added Wellbutrin Feb 2019 and is feeling much better now. She is excited about weight loss also.    Patient Active Problem List   Diagnosis Date Noted  . Left anterior knee pain 03/17/2017  . Lymphedema 08/04/2014  . Acanthosis nigricans 08/02/2014  . Benign essential HTN 08/02/2014  . Allergic contact dermatitis 08/02/2014  . Extreme obesity 08/02/2014  . Infrequent menses 08/02/2014  . Bilateral polycystic ovarian syndrome 08/02/2014  . Allergic rhinitis, seasonal 08/02/2014  . Vitamin D deficiency 10/12/2007    History reviewed. No pertinent surgical history.  Family History  Problem Relation Age of Onset  . High blood pressure Mother   . Thyroid disease Mother   . Depression Mother   . Obesity Mother   .  Obesity Father   . Sleep apnea Father   . Diabetes Paternal Uncle   . Diabetes Paternal Grandfather   . Cancer Paternal Grandmother     Social History   Socioeconomic History  . Marital status: Single    Spouse name: Not on file  . Number of children: 0  . Years of education: Not on file  . Highest education level: Associate degree: occupational, Hotel manager, or vocational program  Occupational History  . Occupation: Actuary: [employer]  Social Needs  . Financial resource strain: Not hard at all  .  Food insecurity:    Worry: Never true    Inability: Never true  . Transportation needs:    Medical: No    Non-medical: No  Tobacco Use  . Smoking status: Never Smoker  . Smokeless tobacco: Never Used  Substance and Sexual Activity  . Alcohol use: No    Alcohol/week: 0.0 oz  . Drug use: No  . Sexual activity: Never  Lifestyle  . Physical activity:    Days per week: 3 days    Minutes per session: 30 min  . Stress: Only a little  Relationships  . Social connections:    Talks on phone: More than three times a week    Gets together: More than three times a week    Attends religious service: Never    Active member of club or organization: Yes    Attends meetings of clubs or organizations: 1 to 4 times per year    Relationship status: Never married  . Intimate partner violence:    Fear of current or ex partner: No    Emotionally abused: No    Physically abused: No    Forced sexual activity: No  Other Topics Concern  . Not on file  Social History Narrative   Lives with her mother and two younger sisters     Current Outpatient Medications:  .  atenolol-chlorthalidone (TENORETIC) 50-25 MG tablet, Take 1 tablet by mouth daily., Disp: 90 tablet, Rfl: 1 .  buPROPion (WELLBUTRIN XL) 300 MG 24 hr tablet, Take 1 tablet (300 mg total) by mouth daily., Disp: 90 tablet, Rfl: 1 .  Dulaglutide (TRULICITY) 1.5 DZ/3.2DJ SOPN, Inject 1.5 mg into the skin once a week., Disp: 4 pen, Rfl: 2 .  escitalopram (LEXAPRO) 10 MG tablet, Take 1 tablet (10 mg total) by mouth daily., Disp: 90 tablet, Rfl: 0 .  Vitamin D, Ergocalciferol, (DRISDOL) 50000 units CAPS capsule, Take 1 capsule (50,000 Units total) by mouth every 7 (seven) days., Disp: 4 capsule, Rfl: 0  Allergies  Allergen Reactions  . Penicillins      ROS  Constitutional: Negative for fever, positive for  weight change.  Respiratory: Negative for cough and shortness of breath.   Cardiovascular: Negative for chest pain or palpitations.   Gastrointestinal: Negative for abdominal pain, no bowel changes.  Musculoskeletal: Negative for gait problem or joint swelling.  Skin: Negative for rash.  Neurological: Negative for dizziness or headache.  No other specific complaints in a complete review of systems (except as listed in HPI above).  Objective  Vitals:   08/23/17 1558  BP: 138/84  Pulse: 85  Resp: 18  Temp: 98.2 F (36.8 C)  TempSrc: Oral  SpO2: 99%  Weight: (!) 420 lb 9.6 oz (190.8 kg)  Height: 5\' 8"  (1.727 m)    Body mass index is 63.95 kg/m.  Physical Exam  Constitutional: Patient appears well-developed and well-nourished.  Obese  No distress.  HEENT: head atraumatic, normocephalic, pupils equal and reactive to light,neck supple, throat within normal limits Cardiovascular: Normal rate, regular rhythm and normal heart sounds.  No murmur heard. No BLE edema. Pulmonary/Chest: Effort normal and breath sounds normal. No respiratory distress. Abdominal: Soft.  There is no tenderness. Psychiatric: Patient has a normal mood and affect. behavior is normal. Judgment and thought content normal.  Recent Results (from the past 2160 hour(s))  Comprehensive metabolic panel     Status: Abnormal   Collection Time: 06/27/17 11:59 AM  Result Value Ref Range   Glucose 78 65 - 99 mg/dL   BUN 13 6 - 20 mg/dL   Creatinine, Ser 0.62 0.57 - 1.00 mg/dL   GFR calc non Af Amer 124 >59 mL/min/1.73   GFR calc Af Amer 143 >59 mL/min/1.73   BUN/Creatinine Ratio 21 9 - 23   Sodium 138 134 - 144 mmol/L   Potassium 4.3 3.5 - 5.2 mmol/L   Chloride 99 96 - 106 mmol/L   CO2 26 20 - 29 mmol/L   Calcium 9.5 8.7 - 10.2 mg/dL   Total Protein 7.3 6.0 - 8.5 g/dL   Albumin 4.0 3.5 - 5.5 g/dL   Globulin, Total 3.3 1.5 - 4.5 g/dL   Albumin/Globulin Ratio 1.2 1.2 - 2.2   Bilirubin Total 0.3 0.0 - 1.2 mg/dL   Alkaline Phosphatase 128 (H) 39 - 117 IU/L   AST 25 0 - 40 IU/L   ALT 49 (H) 0 - 32 IU/L  CBC With Differential     Status: None    Collection Time: 06/27/17 11:59 AM  Result Value Ref Range   WBC 7.7 3.4 - 10.8 x10E3/uL   RBC 4.62 3.77 - 5.28 x10E6/uL   Hemoglobin 13.2 11.1 - 15.9 g/dL   Hematocrit 40.4 34.0 - 46.6 %   MCV 87 79 - 97 fL   MCH 28.6 26.6 - 33.0 pg   MCHC 32.7 31.5 - 35.7 g/dL   RDW 14.7 12.3 - 15.4 %   Neutrophils 50 Not Estab. %   Lymphs 41 Not Estab. %   Monocytes 7 Not Estab. %   Eos 2 Not Estab. %   Basos 0 Not Estab. %   Neutrophils Absolute 3.9 1.4 - 7.0 x10E3/uL   Lymphocytes Absolute 3.1 0.7 - 3.1 x10E3/uL   Monocytes Absolute 0.5 0.1 - 0.9 x10E3/uL   EOS (ABSOLUTE) 0.1 0.0 - 0.4 x10E3/uL   Basophils Absolute 0.0 0.0 - 0.2 x10E3/uL   Immature Granulocytes 0 Not Estab. %   Immature Grans (Abs) 0.0 0.0 - 0.1 x10E3/uL  Insulin, random     Status: None   Collection Time: 06/27/17 11:59 AM  Result Value Ref Range   INSULIN 20.0 2.6 - 24.9 uIU/mL  Hemoglobin A1c     Status: None   Collection Time: 06/27/17 11:59 AM  Result Value Ref Range   Hgb A1c MFr Bld 5.2 4.8 - 5.6 %    Comment:          Prediabetes: 5.7 - 6.4          Diabetes: >6.4          Glycemic control for adults with diabetes: <7.0    Est. average glucose Bld gHb Est-mCnc 103 mg/dL  Lipid Panel With LDL/HDL Ratio     Status: None   Collection Time: 06/27/17 11:59 AM  Result Value Ref Range   Cholesterol, Total 169 100 - 199 mg/dL   Triglycerides 61 0 -  149 mg/dL   HDL 60 >39 mg/dL   VLDL Cholesterol Cal 12 5 - 40 mg/dL   LDL Calculated 97 0 - 99 mg/dL   LDl/HDL Ratio 1.6 0.0 - 3.2 ratio    Comment:                                     LDL/HDL Ratio                                             Men  Women                               1/2 Avg.Risk  1.0    1.5                                   Avg.Risk  3.6    3.2                                2X Avg.Risk  6.2    5.0                                3X Avg.Risk  8.0    6.1   VITAMIN D 25 Hydroxy (Vit-D Deficiency, Fractures)     Status: Abnormal   Collection Time:  06/27/17 11:59 AM  Result Value Ref Range   Vit D, 25-Hydroxy 24.7 (L) 30.0 - 100.0 ng/mL    Comment: Vitamin D deficiency has been defined by the [place] practice guideline as a level of serum 25-OH vitamin D less than 20 ng/mL (1,2). The Endocrine Society went on to further define vitamin D insufficiency as a level between 21 and 29 ng/mL (2). 1. IOM (Institute of Medicine). 2010. Dietary reference    intakes for calcium and D. [place]: The    [employer]. 2. Holick MF, Binkley [place], Bischoff-Ferrari HA, et al.    Evaluation, treatment, and prevention of vitamin D    deficiency: an Endocrine Society clinical practice    guideline. JCEM. 2011 Jul; 96(7):1911-30.   Vitamin B12     Status: None   Collection Time: 06/27/17 11:59 AM  Result Value Ref Range   Vitamin B-12 827 232 - 1,245 pg/mL  Folate     Status: None   Collection Time: 06/27/17 11:59 AM  Result Value Ref Range   Folate 8.7 >3.0 ng/mL    Comment: A serum folate concentration of less than 3.1 ng/mL is considered to represent clinical deficiency.   T3     Status: None   Collection Time: 06/27/17 11:59 AM  Result Value Ref Range   T3, Total 121 71 - 180 ng/dL  T4, free     Status: None   Collection Time: 06/27/17 11:59 AM  Result Value Ref Range   Free T4 1.03 0.82 - 1.77 ng/dL  TSH     Status: None   Collection Time: 06/27/17 11:59 AM  Result Value Ref Range   TSH 2.210 0.450 - 4.500 uIU/mL  PHQ2/9: Depression screen [facility] 2/9 08/23/2017 06/27/2017 05/16/2017 03/17/2017 12/29/2016  Decreased Interest 0 1 1 1 1   Down, Depressed, Hopeless 0 1 0 1 1  PHQ - 2 Score 0 2 1 2 2   Altered sleeping 0 1 0 0 0  Tired, decreased energy 0 2 1 1 1   Change in appetite 0 1 1 2 2   Feeling bad or failure about yourself  0 2 0 1 1  Trouble concentrating 0 0 0 0 0  Moving slowly or fidgety/restless 0 0 0 0 0  Suicidal thoughts 0 0 0 0 0  PHQ-9 Score 0 8 3 6 6   Difficult  doing work/chores - Somewhat difficult Not difficult at all Somewhat difficult Somewhat difficult     Fall Risk: Fall Risk  08/23/2017 05/16/2017 03/17/2017 12/29/2016 06/01/2016  Falls in the past year? No No No No No     Functional Status Survey: Is the patient deaf or have difficulty hearing?: No Does the patient have difficulty seeing, even when wearing glasses/contacts?: No Does the patient have difficulty concentrating, remembering, or making decisions?: No Does the patient have difficulty walking or climbing stairs?: No Does the patient have difficulty dressing or bathing?: No Does the patient have difficulty doing errands alone such as visiting a doctor's office or shopping?: No    Assessment & Plan  1. Morbid obesity ([place])  Going to weight loss clinic in [place], lost 10 lbs since last visit, doing well with life style modification   2. Metabolic syndrome  - Dulaglutide (TRULICITY) 1.5 JH/1.8DU SOPN; Inject 1.5 mg into the skin once a week.  Dispense: 12 pen; Refill: 1  3. Depression, major, recurrent, mild (HCC)  - escitalopram (LEXAPRO) 10 MG tablet; Take 1 tablet (10 mg total) by mouth daily.  Dispense: 90 tablet; Refill: 1  4. Benign essential HTN  bp on her last visit with Dr. Adair Patter showed low bp, today is at goal, we will continue medication, may need to discontinue or decrease dose if stays low

## 2017-09-06 ENCOUNTER — Ambulatory Visit (INDEPENDENT_AMBULATORY_CARE_PROVIDER_SITE_OTHER): Payer: No Typology Code available for payment source | Admitting: Family Medicine

## 2017-09-06 VITALS — BP 102/75 | HR 71 | Temp 98.6°F | Ht 68.0 in | Wt >= 6400 oz

## 2017-09-06 DIAGNOSIS — E8881 Metabolic syndrome: Secondary | ICD-10-CM

## 2017-09-06 DIAGNOSIS — E559 Vitamin D deficiency, unspecified: Secondary | ICD-10-CM

## 2017-09-06 DIAGNOSIS — E66813 Obesity, class 3: Secondary | ICD-10-CM

## 2017-09-06 DIAGNOSIS — E88819 Insulin resistance, unspecified: Secondary | ICD-10-CM

## 2017-09-06 DIAGNOSIS — I10 Essential (primary) hypertension: Secondary | ICD-10-CM

## 2017-09-06 DIAGNOSIS — Z6841 Body Mass Index (BMI) 40.0 and over, adult: Secondary | ICD-10-CM

## 2017-09-07 NOTE — Progress Notes (Signed)
Office: [phone]  /  Fax: [phone]   HPI:   Chief Complaint: OBESITY Kimberly Cannon is here to discuss her progress with her obesity treatment plan. She is on the Category 3 plan and is following her eating plan approximately 90 % of the time. She states she is lifting weights 30 minutes 3 times per week. Jermya finds the meal plan is getting easier. She is looking for replacements to things she used to like. Hunger is controlled. Her weight is (!) 414 lb (187.8 kg) today and has had a weight loss of 3 pounds over a period of 3 weeks since her last visit. She has lost 11 lbs since starting treatment with [place].  Vitamin D deficiency Kimberly Cannon has a diagnosis of vitamin D deficiency. She is currently taking vit D and denies nausea, vomiting or muscle weakness.  Insulin Resistance Kimberly Cannon has a diagnosis of insulin resistance based on her elevated fasting insulin level >5. Although Kimberly Cannon's blood glucose readings are still under good control, insulin resistance puts her at greater risk of metabolic syndrome and diabetes. She has no nausea or vomiting on Trulicity. She continues to work on diet and exercise to decrease risk of diabetes.  Hypertension Kimberly Cannon is a 27 y.o. female with hypertension. She had an episode of lightheadedness at work, but blood pressure was checked and it was controlled. Kimberly Cannon denies chest pain or shortness of breath on exertion. She is working weight loss to help control her blood pressure with the goal of decreasing her risk of heart attack and stroke. Kimberly Cannon blood pressure is controlled today.  ALLERGIES: Allergies  Allergen Reactions  . Penicillins     MEDICATIONS: Current Outpatient Medications on File Prior to Visit  Medication Sig Dispense Refill  . atenolol-chlorthalidone (TENORETIC) 50-25 MG tablet Take 1 tablet by mouth daily. 90 tablet 1  . buPROPion (WELLBUTRIN XL) 300 MG 24 hr tablet Take 1 tablet (300 mg total) by  mouth daily. 90 tablet 1  . Dulaglutide (TRULICITY) 1.5 KP/5.4SF SOPN Inject 1.5 mg into the skin once a week. 12 pen 1  . escitalopram (LEXAPRO) 10 MG tablet Take 1 tablet (10 mg total) by mouth daily. 90 tablet 1  . Vitamin D, Ergocalciferol, (DRISDOL) 50000 units CAPS capsule Take 1 capsule (50,000 Units total) by mouth every 7 (seven) days. 4 capsule 0   No current facility-administered medications on file prior to visit.     PAST MEDICAL HISTORY: Past Medical History:  Diagnosis Date  . Acanthosis nigricans   . Allergy   . Depression   . Elevated serum protein level   . Hypertension   . Knee pain    left  . Morbid obesity with BMI of 60.0-69.9, adult ([place])   . Oligomenorrhea   . PCOS (polycystic ovarian syndrome)   . Vitamin D deficiency     PAST SURGICAL HISTORY: No past surgical history on file.  SOCIAL HISTORY: Social History   Tobacco Use  . Smoking status: Never Smoker  . Smokeless tobacco: Never Used  Substance Use Topics  . Alcohol use: No    Alcohol/week: 0.0 oz  . Drug use: No    FAMILY HISTORY: Family History  Problem Relation Age of Onset  . High blood pressure Mother   . Thyroid disease Mother   . Depression Mother   . Obesity Mother   . Obesity Father   . Sleep apnea Father   . Diabetes Paternal Uncle   . Diabetes Paternal Grandfather   .  Cancer Paternal Grandmother     ROS: Review of Systems  Constitutional: Positive for malaise/fatigue and weight loss.  Respiratory: Negative for shortness of breath (on exertion).   Cardiovascular: Negative for chest pain.  Gastrointestinal: Negative for nausea and vomiting.  Musculoskeletal:       Negative for muscle weakness  Neurological:       Positive for lightheadedness    PHYSICAL EXAM: Blood pressure 102/75, pulse 71, temperature 98.6 F (37 C), temperature source Oral, height 5\' 8"  (1.727 m), weight (!) 414 lb (187.8 kg), last menstrual period 08/20/2017, SpO2 96 %. Body mass index is  62.95 kg/m. Physical Exam  Constitutional: She is oriented to person, place, and time. She appears well-developed and well-nourished.  Cardiovascular: Normal rate.  Pulmonary/Chest: Effort normal.  Musculoskeletal: Normal range of motion.  Neurological: She is oriented to person, place, and time.  Skin: Skin is warm and dry.  Psychiatric: She has a normal mood and affect. Her behavior is normal.  Vitals reviewed.   RECENT LABS AND TESTS: BMET    Component Value Date/Time   NA 138 06/27/2017 1159   NA 139 06/24/2013 0948   K 4.3 06/27/2017 1159   K 3.9 06/24/2013 0948   CL 99 06/27/2017 1159   CL 104 06/24/2013 0948   CO2 26 06/27/2017 1159   CO2 30 06/24/2013 0948   GLUCOSE 78 06/27/2017 1159   GLUCOSE 77 06/09/2016 1530   GLUCOSE 82 06/24/2013 0948   BUN 13 06/27/2017 1159   BUN 11 06/24/2013 0948   CREATININE 0.62 06/27/2017 1159   CREATININE 0.54 06/09/2016 1530   CALCIUM 9.5 06/27/2017 1159   CALCIUM 9.4 06/24/2013 0948   GFRNONAA 124 06/27/2017 1159   GFRNONAA >89 06/09/2016 1530   GFRAA 143 06/27/2017 1159   GFRAA >89 06/09/2016 1530   Lab Results  Component Value Date   HGBA1C 5.2 06/27/2017   HGBA1C 5.0 06/09/2016   HGBA1C 5.5 03/27/2015   HGBA1C 5.5 06/24/2013   HGBA1C 5.5 06/21/2013   Lab Results  Component Value Date   INSULIN 20.0 06/27/2017   CBC    Component Value Date/Time   WBC 7.7 06/27/2017 1159   WBC 6.2 06/09/2016 1530   RBC 4.62 06/27/2017 1159   RBC 4.72 06/09/2016 1530   HGB 13.2 06/27/2017 1159   HCT 40.4 06/27/2017 1159   PLT 336 06/09/2016 1530   PLT 385 (H) 03/27/2015 1136   MCV 87 06/27/2017 1159   MCV 86 06/24/2013 0948   MCH 28.6 06/27/2017 1159   MCH 28.4 06/09/2016 1530   MCHC 32.7 06/27/2017 1159   MCHC 33.2 06/09/2016 1530   RDW 14.7 06/27/2017 1159   RDW 13.7 06/24/2013 0948   LYMPHSABS 3.1 06/27/2017 1159   LYMPHSABS 2.2 06/24/2013 0948   MONOABS 434 06/09/2016 1530   MONOABS 0.6 06/24/2013 0948   EOSABS 0.1  06/27/2017 1159   EOSABS 0.1 06/24/2013 0948   BASOSABS 0.0 06/27/2017 1159   BASOSABS 0.0 06/24/2013 0948   Iron/TIBC/Ferritin/ %Sat No results found for: IRON, TIBC, FERRITIN, IRONPCTSAT Lipid Panel     Component Value Date/Time   CHOL 169 06/27/2017 1159   CHOL 173 06/24/2013 0948   TRIG 61 06/27/2017 1159   TRIG 45 06/24/2013 0948   HDL 60 06/27/2017 1159   HDL 54 06/24/2013 0948   CHOLHDL 2.9 06/09/2016 1530   VLDL 11 06/09/2016 1530   VLDL 9 06/24/2013 0948   LDLCALC 97 06/27/2017 1159   LDLCALC 110 (H) 06/24/2013 4098  Hepatic Function Panel     Component Value Date/Time   PROT 7.3 06/27/2017 1159   PROT 8.9 (H) 06/24/2013 0948   ALBUMIN 4.0 06/27/2017 1159   ALBUMIN 3.6 06/24/2013 0948   AST 25 06/27/2017 1159   AST 30 06/24/2013 0948   ALT 49 (H) 06/27/2017 1159   ALT 34 06/24/2013 0948   ALKPHOS 128 (H) 06/27/2017 1159   ALKPHOS 109 06/24/2013 0948   BILITOT 0.3 06/27/2017 1159   BILITOT 0.4 06/24/2013 0948      Component Value Date/Time   TSH 2.210 06/27/2017 1159   TSH 2.800 03/27/2015 1136   Results for SALAH, BURLISON (MRN [id number]) as of 09/07/2017 09:01  Ref. Range 06/27/2017 11:59  Vitamin D, 25-Hydroxy Latest Ref Range: 30.0 - 100.0 ng/mL 24.7 (L)   ASSESSMENT AND PLAN: Insulin resistance  Essential hypertension  Vitamin D deficiency  Class 3 severe obesity with serious comorbidity and body mass index (BMI) of 60.0 to 69.9 in adult, unspecified obesity type (HCC)  PLAN:  Vitamin D Deficiency Kimberly Cannon was informed that low vitamin D levels contributes to fatigue and are associated with obesity, breast, and colon cancer. She agrees to continue to take prescription Vit D @50 ,000 IU every week (no refill needed) and will follow up for routine testing of vitamin D, at least 2-3 times per year. She was informed of the risk of over-replacement of vitamin D and agrees to not increase her dose unless she discusses this with [place] first.  Insulin  Resistance Kimberly Cannon will continue to work on weight loss, exercise, and decreasing simple carbohydrates in her diet to help decrease the risk of diabetes. We dicussed metformin including benefits and risks. She was informed that eating too many simple carbohydrates or too many calories at one sitting increases the likelihood of GI side effects. Kimberly Cannon agrees to continue Trulicity (no refill needed) and follow up with [place] as directed to monitor her progress.  Hypertension We discussed sodium restriction, working on healthy weight loss, and a regular exercise program as the means to achieve improved blood pressure control. Kimberly Cannon agreed with this plan and agreed to follow up as directed. We will continue to monitor her blood pressure as well as her progress with the above lifestyle modifications. She will continue her medications as prescribed and will watch for signs of hypotension as she continues her lifestyle modifications.  We spent > than 50% of the 15 minute visit on the counseling as documented in the note.  Obesity Kimberly Cannon is currently in the action stage of change. As such, her goal is to continue with weight loss efforts She has agreed to follow the Category 3 plan Kimberly Cannon has been instructed to work up to a goal of 150 minutes of combined cardio and strengthening exercise per week for weight loss and overall health benefits. We discussed the following Behavioral Modification Strategies today: planning for success, better snacking choices, increasing lean protein intake, increasing vegetables and work on meal planning and easy cooking plans  Kimberly Cannon has agreed to follow up with our clinic in 2 to 3 weeks. She was informed of the importance of frequent follow up visits to maximize her success with intensive lifestyle modifications for her multiple health conditions.   OBESITY BEHAVIORAL INTERVENTION VISIT  Today's visit was # 4 out of 22.  Starting weight: 425 lbs Starting date:  06/27/17 Today's weight : 414 lbs  Today's date: 09/06/2017 Total lbs lost to date: 11    ASK: We discussed the diagnosis  of obesity with Kimberly Cannon today and Kimberly Cannon agreed to give [place] permission to discuss obesity behavioral modification therapy today.  ASSESS: Kimberly Cannon has the diagnosis of obesity and her BMI today is 62.96 Kimberly Cannon is in the action stage of change   ADVISE: Kimberly Cannon was educated on the multiple health risks of obesity as well as the benefit of weight loss to improve her health. She was advised of the need for long term treatment and the importance of lifestyle modifications.  AGREE: Multiple dietary modification options and treatment options were discussed and  Kimberly Cannon agreed to the above obesity treatment plan.  I, Kimberly Cannon, am acting as transcriptionist for Eber Jones, MD  I have reviewed the above documentation for accuracy and completeness, and I agree with the above. - Ilene Qua, MD

## 2017-09-20 ENCOUNTER — Ambulatory Visit (INDEPENDENT_AMBULATORY_CARE_PROVIDER_SITE_OTHER): Payer: Self-pay | Admitting: Physician Assistant

## 2017-09-27 ENCOUNTER — Ambulatory Visit (INDEPENDENT_AMBULATORY_CARE_PROVIDER_SITE_OTHER): Payer: No Typology Code available for payment source | Admitting: Physician Assistant

## 2017-09-27 VITALS — BP 121/72 | HR 71 | Temp 99.0°F | Ht 68.0 in | Wt >= 6400 oz

## 2017-09-27 DIAGNOSIS — E559 Vitamin D deficiency, unspecified: Secondary | ICD-10-CM | POA: Diagnosis not present

## 2017-09-27 DIAGNOSIS — Z6841 Body Mass Index (BMI) 40.0 and over, adult: Secondary | ICD-10-CM

## 2017-09-27 DIAGNOSIS — E8881 Metabolic syndrome: Secondary | ICD-10-CM | POA: Diagnosis not present

## 2017-09-27 DIAGNOSIS — Z9189 Other specified personal risk factors, not elsewhere classified: Secondary | ICD-10-CM

## 2017-09-27 MED ORDER — VITAMIN D (ERGOCALCIFEROL) 1.25 MG (50000 UNIT) PO CAPS: 50000.0000 [IU] | ORAL_CAPSULE | ORAL | 0 refills | Status: DC

## 2017-09-27 NOTE — Progress Notes (Signed)
Office: [phone]  /  Fax: [phone]   HPI:   Chief Complaint: OBESITY Kimberly Cannon is here to discuss her progress with her obesity treatment plan. She is on the Category 3 plan and is following her eating plan approximately 60 % of the time. She states she is walking and doing resistance bands 30 minutes 3 times per week. [name] did very well with weight loss. She reports that she is getting bored with the category 3 meal plan, especially with lunch. She would like other options. Her weight is (!) 412 lb (186.9 kg) today and has had a weight loss of 2 pounds over a period of 3 weeks since her last visit. She has lost 13 lbs since starting treatment with [place].  Vitamin D deficiency Kimberly Cannon has a diagnosis of vitamin D deficiency. She is currently taking prescription vit D and denies nausea, vomiting or muscle weakness.  At risk for osteopenia and osteoporosis Kimberly Cannon is at higher risk of osteopenia and osteoporosis due to vitamin D deficiency.   Insulin Resistance Kimberly Cannon has a diagnosis of insulin resistance based on her elevated fasting insulin level >5. Her last level was not at goal. Although Kimberly Cannon blood glucose readings are still under good control, insulin resistance puts her at greater risk of metabolic syndrome and diabetes. She is not taking metformin currently and continues to work on diet and exercise to decrease risk of diabetes. She denies polyphagia.  ALLERGIES: Allergies  Allergen Reactions  . Penicillins     MEDICATIONS: Current Outpatient Medications on File Prior to Visit  Medication Sig Dispense Refill  . atenolol-chlorthalidone (TENORETIC) 50-25 MG tablet Take 1 tablet by mouth daily. 90 tablet 1  . buPROPion (WELLBUTRIN XL) 300 MG 24 hr tablet Take 1 tablet (300 mg total) by mouth daily. 90 tablet 1  . Dulaglutide (TRULICITY) 1.5 JK/9.3OI SOPN Inject 1.5 mg into the skin once a week. 12 pen 1  . escitalopram (LEXAPRO) 10 MG tablet Take 1 tablet (10  mg total) by mouth daily. 90 tablet 1  . Vitamin D, Ergocalciferol, (DRISDOL) 50000 units CAPS capsule Take 1 capsule (50,000 Units total) by mouth every 7 (seven) days. 4 capsule 0   No current facility-administered medications on file prior to visit.     PAST MEDICAL HISTORY: Past Medical History:  Diagnosis Date  . Acanthosis nigricans   . Allergy   . Depression   . Elevated serum protein level   . Hypertension   . Knee pain    left  . Morbid obesity with BMI of 60.0-69.9, adult ([place])   . Oligomenorrhea   . PCOS (polycystic ovarian syndrome)   . Vitamin D deficiency     PAST SURGICAL HISTORY: No past surgical history on file.  SOCIAL HISTORY: Social History   Tobacco Use  . Smoking status: Never Smoker  . Smokeless tobacco: Never Used  Substance Use Topics  . Alcohol use: No    Alcohol/week: 0.0 standard drinks  . Drug use: No    FAMILY HISTORY: Family History  Problem Relation Age of Onset  . High blood pressure Mother   . Thyroid disease Mother   . Depression Mother   . Obesity Mother   . Obesity Father   . Sleep apnea Father   . Diabetes Paternal Uncle   . Diabetes Paternal Grandfather   . Cancer Paternal Grandmother     ROS: Review of Systems  Constitutional: Positive for weight loss.  Gastrointestinal: Negative for nausea and vomiting.  Musculoskeletal:  Negative for muscle weakness  Endo/Heme/Allergies:       Negative for polyphagia    PHYSICAL EXAM: Blood pressure 121/72, pulse 71, temperature 99 F (37.2 C), temperature source Oral, height 5\' 8"  (1.727 m), weight (!) 412 lb (186.9 kg), SpO2 98 %. Body mass index is 62.64 kg/m. Physical Exam  Constitutional: She is oriented to person, place, and time. She appears well-developed and well-nourished.  Cardiovascular: Normal rate.  Pulmonary/Chest: Effort normal.  Musculoskeletal: Normal range of motion.  Neurological: She is oriented to person, place, and time.  Skin: Skin is warm  and dry.  Psychiatric: She has a normal mood and affect. Her behavior is normal.  Vitals reviewed.   RECENT LABS AND TESTS: BMET    Component Value Date/Time   NA 138 06/27/2017 1159   NA 139 06/24/2013 0948   K 4.3 06/27/2017 1159   K 3.9 06/24/2013 0948   CL 99 06/27/2017 1159   CL 104 06/24/2013 0948   CO2 26 06/27/2017 1159   CO2 30 06/24/2013 0948   GLUCOSE 78 06/27/2017 1159   GLUCOSE 77 06/09/2016 1530   GLUCOSE 82 06/24/2013 0948   BUN 13 06/27/2017 1159   BUN 11 06/24/2013 0948   CREATININE 0.62 06/27/2017 1159   CREATININE 0.54 06/09/2016 1530   CALCIUM 9.5 06/27/2017 1159   CALCIUM 9.4 06/24/2013 0948   GFRNONAA 124 06/27/2017 1159   GFRNONAA >89 06/09/2016 1530   GFRAA 143 06/27/2017 1159   GFRAA >89 06/09/2016 1530   Lab Results  Component Value Date   HGBA1C 5.2 06/27/2017   HGBA1C 5.0 06/09/2016   HGBA1C 5.5 03/27/2015   HGBA1C 5.5 06/24/2013   HGBA1C 5.5 06/21/2013   Lab Results  Component Value Date   INSULIN 20.0 06/27/2017   CBC    Component Value Date/Time   WBC 7.7 06/27/2017 1159   WBC 6.2 06/09/2016 1530   RBC 4.62 06/27/2017 1159   RBC 4.72 06/09/2016 1530   HGB 13.2 06/27/2017 1159   HCT 40.4 06/27/2017 1159   PLT 336 06/09/2016 1530   PLT 385 (H) 03/27/2015 1136   MCV 87 06/27/2017 1159   MCV 86 06/24/2013 0948   MCH 28.6 06/27/2017 1159   MCH 28.4 06/09/2016 1530   MCHC 32.7 06/27/2017 1159   MCHC 33.2 06/09/2016 1530   RDW 14.7 06/27/2017 1159   RDW 13.7 06/24/2013 0948   LYMPHSABS 3.1 06/27/2017 1159   LYMPHSABS 2.2 06/24/2013 0948   MONOABS 434 06/09/2016 1530   MONOABS 0.6 06/24/2013 0948   EOSABS 0.1 06/27/2017 1159   EOSABS 0.1 06/24/2013 0948   BASOSABS 0.0 06/27/2017 1159   BASOSABS 0.0 06/24/2013 0948   Iron/TIBC/Ferritin/ %Sat No results found for: IRON, TIBC, FERRITIN, IRONPCTSAT Lipid Panel     Component Value Date/Time   CHOL 169 06/27/2017 1159   CHOL 173 06/24/2013 0948   TRIG 61 06/27/2017 1159    TRIG 45 06/24/2013 0948   HDL 60 06/27/2017 1159   HDL 54 06/24/2013 0948   CHOLHDL 2.9 06/09/2016 1530   VLDL 11 06/09/2016 1530   VLDL 9 06/24/2013 0948   LDLCALC 97 06/27/2017 1159   LDLCALC 110 (H) 06/24/2013 0948   Hepatic Function Panel     Component Value Date/Time   PROT 7.3 06/27/2017 1159   PROT 8.9 (H) 06/24/2013 0948   ALBUMIN 4.0 06/27/2017 1159   ALBUMIN 3.6 06/24/2013 0948   AST 25 06/27/2017 1159   AST 30 06/24/2013 0948   ALT 49 (H)  06/27/2017 1159   ALT 34 06/24/2013 0948   ALKPHOS 128 (H) 06/27/2017 1159   ALKPHOS 109 06/24/2013 0948   BILITOT 0.3 06/27/2017 1159   BILITOT 0.4 06/24/2013 0948      Component Value Date/Time   TSH 2.210 06/27/2017 1159   TSH 2.800 03/27/2015 1136   Results for AMELY, VOORHEIS (MRN [id number]) as of 09/27/2017 16:27  Ref. Range 06/27/2017 11:59  Vitamin D, 25-Hydroxy Latest Ref Range: 30.0 - 100.0 ng/mL 24.7 (L)   ASSESSMENT AND PLAN: Vitamin D deficiency - Plan: Vitamin D, Ergocalciferol, (DRISDOL) 50000 units CAPS capsule  Insulin resistance  At risk for osteoporosis  Class 3 severe obesity with serious comorbidity and body mass index (BMI) of 60.0 to 69.9 in adult, unspecified obesity type ([place])  PLAN:  Vitamin D Deficiency Kimberly Cannon was informed that low vitamin D levels contributes to fatigue and are associated with obesity, breast, and colon cancer. She agrees to continue to take prescription Vit D @50 ,000 IU every week #4 with no refills and will follow up for routine testing of vitamin D, at least 2-3 times per year. She was informed of the risk of over-replacement of vitamin D and agrees to not increase her dose unless she discusses this with [place] first. Kimberly Cannon agrees to follow up as directed.  At risk for osteopenia and osteoporosis Kimberly Cannon is at risk for osteopenia and osteoporosis due to her vitamin D deficiency. She was encouraged to take her vitamin D and follow her higher calcium diet and increase  strengthening exercise to help strengthen her bones and decrease her risk of osteopenia and osteoporosis.  Insulin Resistance Kimberly Cannon will continue to work on weight loss, exercise, and decreasing simple carbohydrates in her diet to help decrease the risk of diabetes. She was informed that eating too many simple carbohydrates or too many calories at one sitting increases the likelihood of GI side effects. Kimberly Cannon agreed to follow up with [place] as directed to monitor her progress.  Obesity Kimberly Cannon is currently in the action stage of change. As such, her goal is to continue with weight loss efforts She has agreed to keep a food journal with 350 to 500 calories and 35+ grams of protein at lunch daily and follow the Category 3 plan Kimberly Cannon has been instructed to work up to a goal of 150 minutes of combined cardio and strengthening exercise per week for weight loss and overall health benefits. We discussed the following Behavioral Modification Strategies today: work on meal planning and easy cooking plans and ways to avoid boredom eating  Kimberly Cannon has agreed to follow up with our clinic in 2 to 3 weeks. She was informed of the importance of frequent follow up visits to maximize her success with intensive lifestyle modifications for her multiple health conditions.   OBESITY BEHAVIORAL INTERVENTION VISIT  Today's visit was # 5 out of 22.  Starting weight: 425 lbs Starting date: 5/14/196 Today's weight : 412 lbs Today's date: 09/27/2017 Total lbs lost to date: 78    ASK: We discussed the diagnosis of obesity with Kimberly Cannon today and Kimberly Cannon agreed to give [place] permission to discuss obesity behavioral modification therapy today.  ASSESS: Kimberly Cannon has the diagnosis of obesity and her BMI today is 62.66 Kimberly Cannon is in the action stage of change   ADVISE: Kimberly Cannon was educated on the multiple health risks of obesity as well as the benefit of weight loss to improve her health. She was  advised of the need for long term  treatment and the importance of lifestyle modifications.  AGREE: Multiple dietary modification options and treatment options were discussed and  Kimberly Cannon agreed to the above obesity treatment plan.  Kimberly Cannon, am acting as transcriptionist for Abby Potash, PA-C I, Abby Potash, PA-C have reviewed above note and agree with its content

## 2017-10-03 ENCOUNTER — Ambulatory Visit (INDEPENDENT_AMBULATORY_CARE_PROVIDER_SITE_OTHER): Payer: Self-pay | Admitting: Family Medicine

## 2017-10-12 ENCOUNTER — Encounter: Payer: Self-pay | Admitting: Family Medicine

## 2017-10-13 ENCOUNTER — Other Ambulatory Visit: Payer: Self-pay | Admitting: Family Medicine

## 2017-10-13 DIAGNOSIS — D229 Melanocytic nevi, unspecified: Secondary | ICD-10-CM

## 2017-10-17 ENCOUNTER — Ambulatory Visit (INDEPENDENT_AMBULATORY_CARE_PROVIDER_SITE_OTHER): Payer: No Typology Code available for payment source | Admitting: Physician Assistant

## 2017-10-17 ENCOUNTER — Encounter (INDEPENDENT_AMBULATORY_CARE_PROVIDER_SITE_OTHER): Payer: Self-pay | Admitting: Physician Assistant

## 2017-10-17 VITALS — BP 104/63 | HR 74 | Temp 98.6°F | Ht 68.0 in | Wt >= 6400 oz

## 2017-10-17 DIAGNOSIS — E559 Vitamin D deficiency, unspecified: Secondary | ICD-10-CM

## 2017-10-17 DIAGNOSIS — Z6841 Body Mass Index (BMI) 40.0 and over, adult: Secondary | ICD-10-CM

## 2017-10-17 NOTE — Progress Notes (Signed)
Office: [phone]  /  Fax: [phone]   HPI:   Chief Complaint: OBESITY Kimberly Cannon is here to discuss her progress with her obesity treatment plan. She is on the keep a food journal with 350-500 calories and 35+ grams of protein at lunch daily and follow the Category 3 plan and is following her eating plan approximately 80 % of the time. She states she is walking and doing resistance exercises for 15 minutes 3 times per week. Kimberly Cannon went on vacation and she reports that she did not follow the plan. She is ready to get back on track.  Her weight is (!) 416 lb (188.7 kg) today and has gained 4 pounds since her last visit. She has lost 9 lbs since starting treatment with [place].  Vitamin D Deficiency Kimberly Cannon has a diagnosis of vitamin D deficiency. She is on prescription Vit D and denies nausea, vomiting or muscle weakness.  ALLERGIES: Allergies  Allergen Reactions  . Penicillins     MEDICATIONS: Current Outpatient Medications on File Prior to Visit  Medication Sig Dispense Refill  . atenolol-chlorthalidone (TENORETIC) 50-25 MG tablet Take 1 tablet by mouth daily. 90 tablet 1  . buPROPion (WELLBUTRIN XL) 300 MG 24 hr tablet Take 1 tablet (300 mg total) by mouth daily. 90 tablet 1  . Dulaglutide (TRULICITY) 1.5 IP/3.8SN SOPN Inject 1.5 mg into the skin once a week. 12 pen 1  . escitalopram (LEXAPRO) 10 MG tablet Take 1 tablet (10 mg total) by mouth daily. 90 tablet 1  . Vitamin D, Ergocalciferol, (DRISDOL) 50000 units CAPS capsule Take 1 capsule (50,000 Units total) by mouth every 7 (seven) days. 4 capsule 0   No current facility-administered medications on file prior to visit.     PAST MEDICAL HISTORY: Past Medical History:  Diagnosis Date  . Acanthosis nigricans   . Allergy   . Depression   . Elevated serum protein level   . Hypertension   . Knee pain    left  . Morbid obesity with BMI of 60.0-69.9, adult ([place])   . Oligomenorrhea   . PCOS (polycystic ovarian syndrome)     . Vitamin D deficiency     PAST SURGICAL HISTORY: No past surgical history on file.  SOCIAL HISTORY: Social History   Tobacco Use  . Smoking status: Never Smoker  . Smokeless tobacco: Never Used  Substance Use Topics  . Alcohol use: No    Alcohol/week: 0.0 standard drinks  . Drug use: No    FAMILY HISTORY: Family History  Problem Relation Age of Onset  . High blood pressure Mother   . Thyroid disease Mother   . Depression Mother   . Obesity Mother   . Obesity Father   . Sleep apnea Father   . Diabetes Paternal Uncle   . Diabetes Paternal Grandfather   . Cancer Paternal Grandmother     ROS: Review of Systems  Constitutional: Negative for weight loss.  Gastrointestinal: Negative for nausea and vomiting.  Musculoskeletal:       Negative muscle weakness    PHYSICAL EXAM: Blood pressure 104/63, pulse 74, temperature 98.6 F (37 C), temperature source Oral, height 5\' 8"  (1.727 m), weight (!) 416 lb (188.7 kg), SpO2 100 %. Body mass index is 63.25 kg/m. Physical Exam  Constitutional: She is oriented to person, place, and time. She appears well-developed and well-nourished.  Cardiovascular: Normal rate.  Pulmonary/Chest: Effort normal.  Musculoskeletal: Normal range of motion.  Neurological: She is oriented to person, place, and time.  Skin: Skin is warm and dry.  Psychiatric: She has a normal mood and affect. Her behavior is normal.  Vitals reviewed.   RECENT LABS AND TESTS: BMET    Component Value Date/Time   NA 138 06/27/2017 1159   NA 139 06/24/2013 0948   K 4.3 06/27/2017 1159   K 3.9 06/24/2013 0948   CL 99 06/27/2017 1159   CL 104 06/24/2013 0948   CO2 26 06/27/2017 1159   CO2 30 06/24/2013 0948   GLUCOSE 78 06/27/2017 1159   GLUCOSE 77 06/09/2016 1530   GLUCOSE 82 06/24/2013 0948   BUN 13 06/27/2017 1159   BUN 11 06/24/2013 0948   CREATININE 0.62 06/27/2017 1159   CREATININE 0.54 06/09/2016 1530   CALCIUM 9.5 06/27/2017 1159   CALCIUM 9.4  06/24/2013 0948   GFRNONAA 124 06/27/2017 1159   GFRNONAA >89 06/09/2016 1530   GFRAA 143 06/27/2017 1159   GFRAA >89 06/09/2016 1530   Lab Results  Component Value Date   HGBA1C 5.2 06/27/2017   HGBA1C 5.0 06/09/2016   HGBA1C 5.5 03/27/2015   HGBA1C 5.5 06/24/2013   HGBA1C 5.5 06/21/2013   Lab Results  Component Value Date   INSULIN 20.0 06/27/2017   CBC    Component Value Date/Time   WBC 7.7 06/27/2017 1159   WBC 6.2 06/09/2016 1530   RBC 4.62 06/27/2017 1159   RBC 4.72 06/09/2016 1530   HGB 13.2 06/27/2017 1159   HCT 40.4 06/27/2017 1159   PLT 336 06/09/2016 1530   PLT 385 (H) 03/27/2015 1136   MCV 87 06/27/2017 1159   MCV 86 06/24/2013 0948   MCH 28.6 06/27/2017 1159   MCH 28.4 06/09/2016 1530   MCHC 32.7 06/27/2017 1159   MCHC 33.2 06/09/2016 1530   RDW 14.7 06/27/2017 1159   RDW 13.7 06/24/2013 0948   LYMPHSABS 3.1 06/27/2017 1159   LYMPHSABS 2.2 06/24/2013 0948   MONOABS 434 06/09/2016 1530   MONOABS 0.6 06/24/2013 0948   EOSABS 0.1 06/27/2017 1159   EOSABS 0.1 06/24/2013 0948   BASOSABS 0.0 06/27/2017 1159   BASOSABS 0.0 06/24/2013 0948   Iron/TIBC/Ferritin/ %Sat No results found for: IRON, TIBC, FERRITIN, IRONPCTSAT Lipid Panel     Component Value Date/Time   CHOL 169 06/27/2017 1159   CHOL 173 06/24/2013 0948   TRIG 61 06/27/2017 1159   TRIG 45 06/24/2013 0948   HDL 60 06/27/2017 1159   HDL 54 06/24/2013 0948   CHOLHDL 2.9 06/09/2016 1530   VLDL 11 06/09/2016 1530   VLDL 9 06/24/2013 0948   LDLCALC 97 06/27/2017 1159   LDLCALC 110 (H) 06/24/2013 0948   Hepatic Function Panel     Component Value Date/Time   PROT 7.3 06/27/2017 1159   PROT 8.9 (H) 06/24/2013 0948   ALBUMIN 4.0 06/27/2017 1159   ALBUMIN 3.6 06/24/2013 0948   AST 25 06/27/2017 1159   AST 30 06/24/2013 0948   ALT 49 (H) 06/27/2017 1159   ALT 34 06/24/2013 0948   ALKPHOS 128 (H) 06/27/2017 1159   ALKPHOS 109 06/24/2013 0948   BILITOT 0.3 06/27/2017 1159   BILITOT 0.4  06/24/2013 0948      Component Value Date/Time   TSH 2.210 06/27/2017 1159   TSH 2.800 03/27/2015 1136  Results for Kimberly, Cannon (MRN [id number]) as of 10/17/2017 17:27  Ref. Range 06/27/2017 11:59  Vitamin D, 25-Hydroxy Latest Ref Range: 30.0 - 100.0 ng/mL 24.7 (L)    ASSESSMENT AND PLAN: Vitamin D deficiency  Class 3  severe obesity with serious comorbidity and body mass index (BMI) of 50.0 to 59.9 in adult, unspecified obesity type ([place])  PLAN:  Vitamin D Deficiency Kimberly Cannon was informed that low vitamin D levels contributes to fatigue and are associated with obesity, breast, and colon cancer. Kimberly Cannon agrees to continue taking prescription Vit D @50 ,000 IU every week and will follow up for routine testing of vitamin D, at least 2-3 times per year. She was informed of the risk of over-replacement of vitamin D and agrees to not increase her dose unless she discusses this with [place] first. We will recheck labs next month. Kimberly Cannon agrees to follow up with our clinic in 3 weeks.  I spent > than 50% of the 15 minute visit on counseling as documented in the note.  Obesity Kimberly Cannon is currently in the action stage of change. As such, her goal is to continue with weight loss efforts She has agreed to change to keep a food journal with 1500 calories and 100 grams of protein daily Kimberly Cannon has been instructed to work up to a goal of 150 minutes of combined cardio and strengthening exercise per week for weight loss and overall health benefits. Recipes and eating out handout given. We discussed the following Behavioral Modification Strategies today: work on meal planning and easy cooking plans and keep a strict food journal   Kimberly Cannon has agreed to follow up with our clinic in 3 weeks. She was informed of the importance of frequent follow up visits to maximize her success with intensive lifestyle modifications for her multiple health conditions.   OBESITY BEHAVIORAL INTERVENTION  VISIT  Today's visit was # 6 out of 22.  Starting weight: 425 lbs Starting date: 06/27/17 Today's weight : 416 lbs  Today's date: 10/17/2017 Total lbs lost to date: 9    ASK: We discussed the diagnosis of obesity with Kimberly Cannon today and Kimberly Cannon agreed to give [place] permission to discuss obesity behavioral modification therapy today.  ASSESS: Kimberly Cannon has the diagnosis of obesity and her BMI today is 63.27 Kimberly Cannon is in the action stage of change   ADVISE: Kimberly Cannon was educated on the multiple health risks of obesity as well as the benefit of weight loss to improve her health. She was advised of the need for long term treatment and the importance of lifestyle modifications.  AGREE: Multiple dietary modification options and treatment options were discussed and  Kimberly Cannon agreed to the above obesity treatment plan.  Kimberly Cannon, am acting as transcriptionist for Abby Potash, PA-C I, Abby Potash, PA-C have reviewed above note and agree with its content

## 2017-11-08 ENCOUNTER — Ambulatory Visit (INDEPENDENT_AMBULATORY_CARE_PROVIDER_SITE_OTHER): Payer: Self-pay | Admitting: Physician Assistant

## 2017-11-09 ENCOUNTER — Ambulatory Visit (INDEPENDENT_AMBULATORY_CARE_PROVIDER_SITE_OTHER): Payer: No Typology Code available for payment source | Admitting: Physician Assistant

## 2017-11-09 VITALS — BP 118/78 | HR 72 | Temp 98.4°F | Ht 68.0 in | Wt >= 6400 oz

## 2017-11-09 DIAGNOSIS — Z6841 Body Mass Index (BMI) 40.0 and over, adult: Secondary | ICD-10-CM

## 2017-11-09 DIAGNOSIS — E8881 Metabolic syndrome: Secondary | ICD-10-CM

## 2017-11-09 DIAGNOSIS — E559 Vitamin D deficiency, unspecified: Secondary | ICD-10-CM | POA: Diagnosis not present

## 2017-11-09 DIAGNOSIS — Z9189 Other specified personal risk factors, not elsewhere classified: Secondary | ICD-10-CM

## 2017-11-09 MED ORDER — VITAMIN D (ERGOCALCIFEROL) 1.25 MG (50000 UNIT) PO CAPS: 50000.0000 [IU] | ORAL_CAPSULE | ORAL | 0 refills | Status: DC

## 2017-11-13 NOTE — Progress Notes (Signed)
Office: [phone]  /  Fax: [phone]   HPI:   Chief Complaint: OBESITY Kimberly Cannon is here to discuss her progress with her obesity treatment plan. She is on the  keep a food journal with 1500 calories and 100 g of protein  and is following her eating plan approximately 80-90 % of the time. She states she is exercising by walking on treadmill for 15-20 minutes 3 times per week. Kimberly Cannon did very well with weight loss. She reports keeping a strict journal. She is asking about incorporating social alcohol to the plan.  Her weight is (!) 412 lb (186.9 kg) today and has had a weight loss of 4 pounds over a period of 3 weeks since her last visit. She has lost 13 lbs since starting treatment with [place].  Vitamin D deficiency Kimberly Cannon has a diagnosis of vitamin D deficiency. She is currently taking vit D and denies nausea, vomiting or muscle weakness.   Ref. Range 06/27/2017 11:59  Vitamin D, 25-Hydroxy Latest Ref Range: 30.0 - 100.0 ng/mL 24.7 (L)   Insulin Resistance Kimberly Cannon has a diagnosis of insulin resistance based on her elevated fasting insulin level >5. Although Kimberly Cannon's blood glucose readings are still under good control, insulin resistance puts her at greater risk of metabolic syndrome and diabetes. She is not taking metformin currently and continues to work on diet and exercise to decrease risk of diabetes. She denies polyphagia.   At risk for osteopenia and osteoporosis Kimberly Cannon is at higher risk of osteopenia and osteoporosis due to vitamin D deficiency.   ALLERGIES: Allergies  Allergen Reactions  . Penicillins     MEDICATIONS: Current Outpatient Medications on File Prior to Visit  Medication Sig Dispense Refill  . atenolol-chlorthalidone (TENORETIC) 50-25 MG tablet Take 1 tablet by mouth daily. 90 tablet 1  . buPROPion (WELLBUTRIN XL) 300 MG 24 hr tablet Take 1 tablet (300 mg total) by mouth daily. 90 tablet 1  . Dulaglutide (TRULICITY) 1.5 NT/6.1WE SOPN Inject 1.5 mg  into the skin once a week. 12 pen 1  . escitalopram (LEXAPRO) 10 MG tablet Take 1 tablet (10 mg total) by mouth daily. 90 tablet 1   No current facility-administered medications on file prior to visit.     PAST MEDICAL HISTORY: Past Medical History:  Diagnosis Date  . Acanthosis nigricans   . Allergy   . Depression   . Elevated serum protein level   . Hypertension   . Knee pain    left  . Morbid obesity with BMI of 60.0-69.9, adult ([place])   . Oligomenorrhea   . PCOS (polycystic ovarian syndrome)   . Vitamin D deficiency     PAST SURGICAL HISTORY: No past surgical history on file.  SOCIAL HISTORY: Social History   Tobacco Use  . Smoking status: Never Smoker  . Smokeless tobacco: Never Used  Substance Use Topics  . Alcohol use: No    Alcohol/week: 0.0 standard drinks  . Drug use: No    FAMILY HISTORY: Family History  Problem Relation Age of Onset  . High blood pressure Mother   . Thyroid disease Mother   . Depression Mother   . Obesity Mother   . Obesity Father   . Sleep apnea Father   . Diabetes Paternal Uncle   . Diabetes Paternal Grandfather   . Cancer Paternal Grandmother     ROS: Review of Systems  Constitutional: Positive for weight loss.  Gastrointestinal: Negative for nausea and vomiting.  Musculoskeletal:  Negative for muscle weakness  Endo/Heme/Allergies:       Negative for polyphagia     PHYSICAL EXAM: Blood pressure 118/78, pulse 72, temperature 98.4 F (36.9 C), temperature source Oral, height 5\' 8"  (1.727 m), weight (!) 412 lb (186.9 kg), last menstrual period 10/16/2017, SpO2 96 %. Body mass index is 62.64 kg/m. Physical Exam  Constitutional: She is oriented to person, place, and time. She appears well-developed and well-nourished.  HENT:  Head: Normocephalic.  Cardiovascular: Normal rate.  Pulmonary/Chest: Effort normal.  Musculoskeletal: Normal range of motion.  Neurological: She is alert and oriented to person, place, and  time.  Skin: Skin is warm and dry.  Psychiatric: She has a normal mood and affect. Her behavior is normal.  Vitals reviewed.   RECENT LABS AND TESTS: BMET    Component Value Date/Time   NA 138 06/27/2017 1159   NA 139 06/24/2013 0948   K 4.3 06/27/2017 1159   K 3.9 06/24/2013 0948   CL 99 06/27/2017 1159   CL 104 06/24/2013 0948   CO2 26 06/27/2017 1159   CO2 30 06/24/2013 0948   GLUCOSE 78 06/27/2017 1159   GLUCOSE 77 06/09/2016 1530   GLUCOSE 82 06/24/2013 0948   BUN 13 06/27/2017 1159   BUN 11 06/24/2013 0948   CREATININE 0.62 06/27/2017 1159   CREATININE 0.54 06/09/2016 1530   CALCIUM 9.5 06/27/2017 1159   CALCIUM 9.4 06/24/2013 0948   GFRNONAA 124 06/27/2017 1159   GFRNONAA >89 06/09/2016 1530   GFRAA 143 06/27/2017 1159   GFRAA >89 06/09/2016 1530   Lab Results  Component Value Date   HGBA1C 5.2 06/27/2017   HGBA1C 5.0 06/09/2016   HGBA1C 5.5 03/27/2015   HGBA1C 5.5 06/24/2013   HGBA1C 5.5 06/21/2013   Lab Results  Component Value Date   INSULIN 20.0 06/27/2017   CBC    Component Value Date/Time   WBC 7.7 06/27/2017 1159   WBC 6.2 06/09/2016 1530   RBC 4.62 06/27/2017 1159   RBC 4.72 06/09/2016 1530   HGB 13.2 06/27/2017 1159   HCT 40.4 06/27/2017 1159   PLT 336 06/09/2016 1530   PLT 385 (H) 03/27/2015 1136   MCV 87 06/27/2017 1159   MCV 86 06/24/2013 0948   MCH 28.6 06/27/2017 1159   MCH 28.4 06/09/2016 1530   MCHC 32.7 06/27/2017 1159   MCHC 33.2 06/09/2016 1530   RDW 14.7 06/27/2017 1159   RDW 13.7 06/24/2013 0948   LYMPHSABS 3.1 06/27/2017 1159   LYMPHSABS 2.2 06/24/2013 0948   MONOABS 434 06/09/2016 1530   MONOABS 0.6 06/24/2013 0948   EOSABS 0.1 06/27/2017 1159   EOSABS 0.1 06/24/2013 0948   BASOSABS 0.0 06/27/2017 1159   BASOSABS 0.0 06/24/2013 0948   Iron/TIBC/Ferritin/ %Sat No results found for: IRON, TIBC, FERRITIN, IRONPCTSAT Lipid Panel     Component Value Date/Time   CHOL 169 06/27/2017 1159   CHOL 173 06/24/2013 0948    TRIG 61 06/27/2017 1159   TRIG 45 06/24/2013 0948   HDL 60 06/27/2017 1159   HDL 54 06/24/2013 0948   CHOLHDL 2.9 06/09/2016 1530   VLDL 11 06/09/2016 1530   VLDL 9 06/24/2013 0948   LDLCALC 97 06/27/2017 1159   LDLCALC 110 (H) 06/24/2013 0948   Hepatic Function Panel     Component Value Date/Time   PROT 7.3 06/27/2017 1159   PROT 8.9 (H) 06/24/2013 0948   ALBUMIN 4.0 06/27/2017 1159   ALBUMIN 3.6 06/24/2013 0948   AST 25 06/27/2017  1159   AST 30 06/24/2013 0948   ALT 49 (H) 06/27/2017 1159   ALT 34 06/24/2013 0948   ALKPHOS 128 (H) 06/27/2017 1159   ALKPHOS 109 06/24/2013 0948   BILITOT 0.3 06/27/2017 1159   BILITOT 0.4 06/24/2013 0948      Component Value Date/Time   TSH 2.210 06/27/2017 1159   TSH 2.800 03/27/2015 1136    Ref. Range 06/27/2017 11:59  Vitamin D, 25-Hydroxy Latest Ref Range: 30.0 - 100.0 ng/mL 24.7 (L)    ASSESSMENT AND PLAN: Vitamin D deficiency - Plan: Vitamin D, Ergocalciferol, (DRISDOL) 50000 units CAPS capsule  Insulin resistance  At risk for osteoporosis  Class 3 severe obesity with serious comorbidity and body mass index (BMI) of 60.0 to 69.9 in adult, unspecified obesity type ([place])  PLAN: Vitamin D Deficiency Marsheila was informed that low vitamin D levels contributes to fatigue and are associated with obesity, breast, and colon cancer. She agrees to continue to take prescription Vit D @50 ,000 IU every week #4 with no refills and will follow up for routine testing of vitamin D, at least 2-3 times per year. She was informed of the risk of over-replacement of vitamin D and agrees to not increase her dose unless she discusses this with [place] first. Agrees to follow up with our clinic as directed.   Insulin Resistance Jaeden will continue to work on weight loss, exercise, and decreasing simple carbohydrates in her diet to help decrease the risk of diabetes. We dicussed metformin including benefits and risks. She was informed that eating too many  simple carbohydrates or too many calories at one sitting increases the likelihood of GI side effects. Aiyla declined metformin for now and prescription was not written today. Paizley agreed to follow up with [place] as directed to monitor her progress.  At risk for osteopenia and osteoporosis Kathlene was given extended  (15 minutes) osteoporosis prevention counseling today. Clarann is at risk for osteopenia and osteoporosis due to her vitamin D deficiency. She was encouraged to take her vitamin D and follow her higher calcium diet and increase strengthening exercise to help strengthen her bones and decrease her risk of osteopenia and osteoporosis.  Obesity Kayleana is currently in the action stage of change. As such, her goal is to continue with weight loss efforts She has agreed to keep a food journal with 1500 calories and 100 g of  protein  Brinlynn has been instructed to work up to a goal of 150 minutes of combined cardio and strengthening exercise per week for weight loss and overall health benefits. We discussed the following Behavioral Modification Strategies today: work on meal planning and easy cooking plans and keeping strict food journal.    Yakelin has agreed to follow up with our clinic in 3 weeks. She was informed of the importance of frequent follow up visits to maximize her success with intensive lifestyle modifications for her multiple health conditions.   OBESITY BEHAVIORAL INTERVENTION VISIT  Today's visit was # 7   Starting weight: 425 lb Starting date: 06/27/17 Today's weight : 412 lb Today's date: 11/09/17 Total lbs lost to date: 13 lb    ASK: We discussed the diagnosis of obesity with Harolyn L Townshend today and Berdene agreed to give [place] permission to discuss obesity behavioral modification therapy today.  ASSESS: Sheilah has the diagnosis of obesity and her BMI today is 62.66 Lilly is in the action stage of change   ADVISE: Rekisha was educated on  the multiple health risks of  obesity as well as the benefit of weight loss to improve her health. She was advised of the need for long term treatment and the importance of lifestyle modifications to improve her current health and to decrease her risk of future health problems.  AGREE: Multiple dietary modification options and treatment options were discussed and  Zharia agreed to follow the recommendations documented in the above note.  ARRANGE: Catharine was educated on the importance of frequent visits to treat obesity as outlined per CMS and USPSTF guidelines and agreed to schedule her next follow up appointment today.  Leary Roca, am acting as transcriptionist for Abby Potash, PA-C I, Abby Potash, PA-C have reviewed above note and agree with its content

## 2017-11-14 ENCOUNTER — Encounter: Payer: No Typology Code available for payment source | Admitting: Family Medicine

## 2017-11-26 ENCOUNTER — Encounter (INDEPENDENT_AMBULATORY_CARE_PROVIDER_SITE_OTHER): Payer: Self-pay | Admitting: Physician Assistant

## 2017-11-29 ENCOUNTER — Encounter (INDEPENDENT_AMBULATORY_CARE_PROVIDER_SITE_OTHER): Payer: Self-pay

## 2017-11-29 ENCOUNTER — Ambulatory Visit (INDEPENDENT_AMBULATORY_CARE_PROVIDER_SITE_OTHER): Payer: Self-pay | Admitting: Physician Assistant

## 2017-12-07 ENCOUNTER — Encounter: Payer: Self-pay | Admitting: Family Medicine

## 2017-12-07 DIAGNOSIS — D239 Other benign neoplasm of skin, unspecified: Secondary | ICD-10-CM | POA: Insufficient documentation

## 2017-12-19 ENCOUNTER — Other Ambulatory Visit: Payer: Self-pay | Admitting: Family Medicine

## 2017-12-19 DIAGNOSIS — F330 Major depressive disorder, recurrent, mild: Secondary | ICD-10-CM

## 2018-01-16 ENCOUNTER — Telehealth: Payer: No Typology Code available for payment source | Admitting: Physician Assistant

## 2018-01-16 DIAGNOSIS — J069 Acute upper respiratory infection, unspecified: Secondary | ICD-10-CM

## 2018-01-16 MED ORDER — IPRATROPIUM BROMIDE 0.03 % NA SOLN: 2.0000 | Freq: Two times a day (BID) | NASAL | 0 refills | Status: DC

## 2018-01-16 NOTE — Progress Notes (Signed)
We are sorry you are not feeling well.  Here is how we plan to help!  Based on what you have shared with me, it looks like you may have a viral upper respiratory infection or a "common cold".  Colds are caused by a large number of viruses; however, rhinovirus is the most common cause.   Symptoms of the common cold vary from person to person, with common symptoms including sore throat, cough, and malaise.  A low-grade fever of 100.4 may present, but is often uncommon.  Symptoms vary however, and are closely related to a person's age or underlying illnesses.  The most common symptoms associated with the common cold are nasal discharge or congestion, cough, sneezing, headache and pressure in the ears and face.  Cold symptoms usually persist for about 3 to 10 days, but can last up to 2 weeks.  It is important to know that colds do not cause serious illness or complications in most cases.    The common cold is transmitted from person to person, with the most common method of transmission being a person's hands.  The virus is able to live on the skin and can infect other persons for up to 2 hours after direct contact.  Also, colds are transmitted when someone coughs or sneezes; thus, it is important to cover the mouth to reduce this risk.  To keep the spread of the common cold at [place], good hand hygiene is very important.  This is an infection that is most likely caused by a virus. There are no specific treatments for the common cold other than to help you with the symptoms until the infection runs its course.    For nasal congestion, you may use an oral decongestants such as Mucinex D or if you have glaucoma or high blood pressure use plain Mucinex.  Saline nasal spray or nasal drops can help and can safely be used as often as needed for congestion.  For your congestion, I have prescribed Ipratropium Bromide nasal spray 0.03% two sprays in each nostril 2-3 times a day  If you do not have a history of heart  disease, hypertension, diabetes or thyroid disease, prostate/bladder issues or glaucoma, you may also use Sudafed to treat nasal congestion.  It is highly recommended that you consult with a pharmacist or your primary care physician to ensure this medication is safe for you to take.     If you have a cough, you may use cough suppressants such as Delsym and Robitussin.  If you have glaucoma or high blood pressure, you can also use Coricidin HBP.   If you have a sore or scratchy throat, use a saltwater gargle-  to  teaspoon of salt dissolved in a 4-ounce to 8-ounce glass of warm water.  Gargle the solution for approximately 15-30 seconds and then spit.  It is important not to swallow the solution.  You can also use throat lozenges/cough drops and Chloraseptic spray to help with throat pain or discomfort.  Warm or cold liquids can also be helpful in relieving throat pain.  For headache, pain or general discomfort, you can use Ibuprofen or Tylenol as directed.   Some authorities believe that zinc sprays or the use of Echinacea may shorten the course of your symptoms.   HOME CARE Only take medications as instructed by your medical team. Be sure to drink plenty of fluids. Water is fine as well as fruit juices, sodas and electrolyte beverages. You may want to stay away  from caffeine or alcohol. If you are nauseated, try taking small sips of liquids. How do you know if you are getting enough fluid? Your urine should be a pale yellow or almost colorless. Get rest. Taking a steamy shower or using a humidifier may help nasal congestion and ease sore throat pain. You can place a towel over your head and breathe in the steam from hot water coming from a faucet. Using a saline nasal spray works much the same way. Cough drops, hard candies and sore throat lozenges may ease your cough. Avoid close contacts especially the very young and the elderly Cover your mouth if you cough or sneeze Always remember to wash  your hands.   GET HELP RIGHT AWAY IF: You develop worsening fever. If your symptoms do not improve within 10 days You develop yellow or green discharge from your nose over 3 days. You have coughing fits You develop a severe head ache or visual changes. You develop shortness of breath, difficulty breathing or start having chest pain  Your symptoms persist after you have completed your treatment plan  MAKE SURE YOU  Understand these instructions. Will watch your condition. Will get help right away if you are not doing well or get worse.  Your e-visit answers were reviewed by a board certified advanced clinical practitioner to complete your personal care plan. Depending upon the condition, your plan could have included both over the counter or prescription medications. Please review your pharmacy choice. If there is a problem, you may call our nursing hot line at and have the prescription routed to another pharmacy. Your safety is important to [place]. If you have drug allergies check your prescription carefully.   You can use MyChart to ask questions about today's visit, request a non-urgent call back, or ask for a work or school excuse for 24 hours related to this e-Visit. If it has been greater than 24 hours you will need to follow up with your provider, or enter a new e-Visit to address those concerns. You will get an e-mail in the next two days asking about your experience.  I hope that your e-visit has been valuable and will speed your recovery. Thank you for using e-visits.      ===View-only below this line===   ----- Message -----    From: Alric Ran    Sent: 01/16/2018 11:45 AM EST      To: E-Visit Mailing List Subject: E-Visit Submission: Sinus Problems  E-Visit Submission: Sinus Problems --------------------------------  Question: Which of the following have you been experiencing? Answer:   Pain around the nose and face            Ear pain            Neck pain             Throat pain  Question: Have these symptoms significantly worsened over the last two to three days? Answer:   Yes  Question: Describe your sore throat: Answer:   Uncomfortable to swallow  Question: How long have you had a sore throat? Answer:   2 days  Question: Do you have any tenderness or swelling in your neck? Answer:   Yes  Question: Have you had any of the following? Answer:   None of the above  Question: How long have you been having these symptoms? Answer:   2 days  Question: Do you have a fever? Answer:   No, I do not have a fever  Question:  Do you smoke? Answer:   No  Question: Have you ever smoked? Answer:   I have never smoked  Question: Do you have any chronic illnesses, such as diabetes, heart disease, or lung disease, or any illness that would weaken your body's ability to fight infection? Answer:   No  Question: When you blow your nose, what color is the mucus? Answer:   Mostly thin and yellow or green  Question: Have you experienced similar problems in the past? Answer:   Yes  Question: What treatments have worked in the past?  Answer:   Antibiotics  Question: What treatment(s) in the past have been unsuccessful? Answer:     Question: Is this illness similar to previous illnesses you have had?  How is it the same?  How is it different? Answer:   Similar to a previous ear infection. Neck wasnt as sore previously  Question: Have you recently been hospitalized? Answer:   No  Question: What medications are you currently taking for these symptoms? Answer:   Pain medicine  Question: Please enter the names of any medications you are taking, or any other treatments you are trying. Answer:   Dayquil  Question: Are you pregnant? Answer:   I am confident that I am not pregnant  Question: Are you breastfeeding? Answer:   No  Question: Please list your medication allergies that you may have ? (If 'none' , please list as 'none') Answer:    Penicillin  Question: Please list any additional comments  Answer:

## 2018-01-22 ENCOUNTER — Other Ambulatory Visit: Payer: Self-pay | Admitting: Family Medicine

## 2018-01-22 DIAGNOSIS — I10 Essential (primary) hypertension: Secondary | ICD-10-CM

## 2018-02-09 ENCOUNTER — Telehealth: Payer: No Typology Code available for payment source | Admitting: Family

## 2018-02-09 DIAGNOSIS — N390 Urinary tract infection, site not specified: Secondary | ICD-10-CM

## 2018-02-09 MED ORDER — NITROFURANTOIN MONOHYD MACRO 100 MG PO CAPS: 100.0000 mg | ORAL_CAPSULE | Freq: Two times a day (BID) | ORAL | 0 refills | Status: DC

## 2018-02-09 NOTE — Progress Notes (Signed)

## 2018-02-14 ENCOUNTER — Telehealth: Payer: No Typology Code available for payment source | Admitting: Family

## 2018-02-14 DIAGNOSIS — J069 Acute upper respiratory infection, unspecified: Secondary | ICD-10-CM | POA: Diagnosis not present

## 2018-02-14 DIAGNOSIS — B9789 Other viral agents as the cause of diseases classified elsewhere: Secondary | ICD-10-CM

## 2018-02-14 MED ORDER — FLUTICASONE PROPIONATE 50 MCG/ACT NA SUSP: 2.0000 | Freq: Every day | NASAL | 6 refills | Status: DC

## 2018-02-14 MED ORDER — BENZONATATE 100 MG PO CAPS: 100.0000 mg | ORAL_CAPSULE | Freq: Three times a day (TID) | ORAL | 0 refills | Status: DC | PRN

## 2018-02-14 NOTE — Progress Notes (Signed)
We are sorry you are not feeling well.  Here is how we plan to help!  Based on what you have shared with me, it looks like you may have a viral upper respiratory infection or a "common cold".  Colds are caused by a large number of viruses; however, rhinovirus is the most common cause.   Symptoms of the common cold vary from person to person, with common symptoms including sore throat, cough, and malaise.  A low-grade fever of 100.4 may present, but is often uncommon.  Symptoms vary however, and are closely related to a person's age or underlying illnesses.  The most common symptoms associated with the common cold are nasal discharge or congestion, cough, sneezing, headache and pressure in the ears and face.  Cold symptoms usually persist for about 3 to 10 days, but can last up to 2 weeks.  It is important to know that colds do not cause serious illness or complications in most cases.    The common cold is transmitted from person to person, with the most common method of transmission being a person's hands.  The virus is able to live on the skin and can infect other persons for up to 2 hours after direct contact.  Also, colds are transmitted when someone coughs or sneezes; thus, it is important to cover the mouth to reduce this risk.  To keep the spread of the common cold at [place], good hand hygiene is very important.  This is an infection that is most likely caused by a virus. There are no specific treatments for the common cold other than to help you with the symptoms until the infection runs its course.    For nasal congestion, you may use an oral decongestants such as Mucinex D or if you have glaucoma or high blood pressure use plain Mucinex.  Saline nasal spray or nasal drops can help and can safely be used as often as needed for congestion.  For your congestion, I have prescribed Fluticasone nasal spray one spray in each nostril twice a day.  If you do not have a history of heart disease,  hypertension, diabetes or thyroid disease, prostate/bladder issues or glaucoma, you may also use Sudafed to treat nasal congestion.  It is highly recommended that you consult with a pharmacist or your primary care physician to ensure this medication is safe for you to take.     If you have a cough, you may use cough suppressants such as Delsym and Robitussin.  If you have glaucoma or high blood pressure, you can also use Coricidin HBP.   For cough I have prescribed for you A prescription cough medication called Tessalon Perles 100 mg. You may take 1-2 capsules every 8 hours as needed for cough.  Providers prescribe antibiotics to treat infections caused by bacteria. Antibiotics are very powerful in treating bacterial infections when they are used properly. To maintain their effectiveness, they should be used only when necessary. Overuse of antibiotics has resulted in the development of superbugs that are resistant to treatment!    After careful review of your answers, I would not recommend an antibiotic for your condition.  Antibiotics are not effective against viruses and therefore should not be used to treat them. Common examples of infections caused by viruses include colds and flu.  If you have a sore or scratchy throat, use a saltwater gargle-  to  teaspoon of salt dissolved in a 4-ounce to 8-ounce glass of warm water.  Gargle the solution  for approximately 15-30 seconds and then spit.  It is important not to swallow the solution.  You can also use throat lozenges/cough drops and Chloraseptic spray to help with throat pain or discomfort.  Warm or cold liquids can also be helpful in relieving throat pain.  For headache, pain or general discomfort, you can use Ibuprofen or Tylenol as directed.   Some authorities believe that zinc sprays or the use of Echinacea may shorten the course of your symptoms.   HOME CARE . Only take medications as instructed by your medical team. . Be sure to drink  plenty of fluids. Water is fine as well as fruit juices, sodas and electrolyte beverages. You may want to stay away from caffeine or alcohol. If you are nauseated, try taking small sips of liquids. How do you know if you are getting enough fluid? Your urine should be a pale yellow or almost colorless. . Get rest. . Taking a steamy shower or using a humidifier may help nasal congestion and ease sore throat pain. You can place a towel over your head and breathe in the steam from hot water coming from a faucet. . Using a saline nasal spray works much the same way. . Cough drops, hard candies and sore throat lozenges may ease your cough. . Avoid close contacts especially the very young and the elderly . Cover your mouth if you cough or sneeze . Always remember to wash your hands.   GET HELP RIGHT AWAY IF: . You develop worsening fever. . If your symptoms do not improve within 10 days . You develop yellow or green discharge from your nose over 3 days. . You have coughing fits . You develop a severe head ache or visual changes. . You develop shortness of breath, difficulty breathing or start having chest pain . Your symptoms persist after you have completed your treatment plan  MAKE SURE YOU   Understand these instructions.  Will watch your condition.  Will get help right away if you are not doing well or get worse.  Your e-visit answers were reviewed by a board certified advanced clinical practitioner to complete your personal care plan. Depending upon the condition, your plan could have included both over the counter or prescription medications. Please review your pharmacy choice. If there is a problem, you may call our nursing hot line at and have the prescription routed to another pharmacy. Your safety is important to [place]. If you have drug allergies check your prescription carefully.   You can use MyChart to ask questions about today's visit, request a non-urgent call back, or ask for a  work or school excuse for 24 hours related to this e-Visit. If it has been greater than 24 hours you will need to follow up with your provider, or enter a new e-Visit to address those concerns. You will get an e-mail in the next two days asking about your experience.  I hope that your e-visit has been valuable and will speed your recovery. Thank you for using e-visits.

## 2018-02-15 ENCOUNTER — Encounter: Payer: Self-pay | Admitting: Family Medicine

## 2018-02-15 ENCOUNTER — Ambulatory Visit (INDEPENDENT_AMBULATORY_CARE_PROVIDER_SITE_OTHER): Payer: No Typology Code available for payment source | Admitting: Family Medicine

## 2018-02-15 VITALS — BP 136/74 | HR 89 | Temp 97.9°F | Resp 20 | Ht 68.0 in | Wt >= 6400 oz

## 2018-02-15 DIAGNOSIS — B9789 Other viral agents as the cause of diseases classified elsewhere: Secondary | ICD-10-CM | POA: Diagnosis not present

## 2018-02-15 DIAGNOSIS — J069 Acute upper respiratory infection, unspecified: Secondary | ICD-10-CM

## 2018-02-15 DIAGNOSIS — J0140 Acute pansinusitis, unspecified: Secondary | ICD-10-CM

## 2018-02-15 MED ORDER — GUAIFENESIN ER 600 MG PO TB12: 600.0000 mg | ORAL_TABLET | Freq: Two times a day (BID) | ORAL | 0 refills | Status: DC | PRN

## 2018-02-15 MED ORDER — AZELASTINE-FLUTICASONE 137-50 MCG/ACT NA SUSP: 1.0000 | Freq: Every day | NASAL | 1 refills | Status: DC

## 2018-02-15 MED ORDER — DOXYCYCLINE HYCLATE 100 MG PO TABS: 100.0000 mg | ORAL_TABLET | Freq: Two times a day (BID) | ORAL | 0 refills | Status: AC

## 2018-02-15 NOTE — Progress Notes (Signed)
Name: Kimberly Cannon   MRN: [id number]    DOB: [birth year]   Date:02/15/2018       Progress Note  Subjective  Chief Complaint  Chief Complaint  Patient presents with  . Sinus Problem    Onset-Friday, sinus pressure and pain on right side, congestion, chills last night. Has been taking NyQuil and DayQuil. Blood mucus and green phlegm-coughing  . Ear Pressure    HPI  Pt presents with concern for sinus congestion, some blood tinged sputum, and right otalgia x7 days. Her cough occurs when she blows her nose, congested cough - she feels like the sputum is coming from her PND and not from her lungs as her chest symptoms have been quite mild.  She states that things started off mild last week, but things got a lot worse 3 days ago.  She did an e-visit and was told she had a viral infection, but things have worsened since then. No chest pain, shortness of breath, headache, fevers, GI upset. Endorses chills, decreased appetite. Has tried nyquil and dayquil without relief of symptoms.   Patient Active Problem List   Diagnosis Date Noted  . Dermatofibroma 12/07/2017  . Left anterior knee pain 03/17/2017  . Lymphedema 08/04/2014  . Acanthosis nigricans 08/02/2014  . Benign essential HTN 08/02/2014  . Allergic contact dermatitis 08/02/2014  . Extreme obesity 08/02/2014  . Infrequent menses 08/02/2014  . Bilateral polycystic ovarian syndrome 08/02/2014  . Allergic rhinitis, seasonal 08/02/2014  . Vitamin D deficiency 10/12/2007    Social History   Tobacco Use  . Smoking status: Never Smoker  . Smokeless tobacco: Never Used  Substance Use Topics  . Alcohol use: No    Alcohol/week: 0.0 standard drinks     Current Outpatient Medications:  .  atenolol-chlorthalidone (TENORETIC) 50-25 MG tablet, TAKE 1 TABLET BY MOUTH DAILY., Disp: 90 tablet, Rfl: 0 .  buPROPion (WELLBUTRIN XL) 300 MG 24 hr tablet, TAKE 1 TABLET (300 MG TOTAL) BY MOUTH DAILY., Disp: 90 tablet, Rfl: 0 .  D3-50 1.25 MG  (50000 UT) capsule, , Disp: , Rfl: 0 .  Dulaglutide (TRULICITY) 1.5 JJ/8.8CZ SOPN, Inject 1.5 mg into the skin once a week., Disp: 12 pen, Rfl: 1 .  escitalopram (LEXAPRO) 10 MG tablet, Take 1 tablet (10 mg total) by mouth daily., Disp: 90 tablet, Rfl: 1 .  Vitamin D, Ergocalciferol, (DRISDOL) 50000 units CAPS capsule, Take 1 capsule (50,000 Units total) by mouth every 7 (seven) days., Disp: 4 capsule, Rfl: 0 .  benzonatate (TESSALON PERLES) 100 MG capsule, Take 1 capsule (100 mg total) by mouth 3 (three) times daily as needed. (Patient not taking: Reported on 02/15/2018), Disp: 20 capsule, Rfl: 0 .  fluticasone (FLONASE) 50 MCG/ACT nasal spray, Place 2 sprays into both nostrils daily. (Patient not taking: Reported on 02/15/2018), Disp: 16 g, Rfl: 6 .  ipratropium (ATROVENT) 0.03 % nasal spray, Place 2 sprays into both nostrils every 12 (twelve) hours. (Patient not taking: Reported on 02/15/2018), Disp: 30 mL, Rfl: 0 .  nitrofurantoin, macrocrystal-monohydrate, (MACROBID) 100 MG capsule, Take 1 capsule (100 mg total) by mouth 2 (two) times daily. (Patient not taking: Reported on 02/15/2018), Disp: 10 capsule, Rfl: 0  Allergies  Allergen Reactions  . Penicillins     I personally reviewed active problem list, medication list, allergies, lab results with the patient/caregiver today.  ROS  Ten systems reviewed and is negative except as mentioned in HPI  Objective  Vitals:   02/15/18 1053  BP: 136/74  Pulse: 89  Resp: 20  Temp: 97.9 F (36.6 C)  TempSrc: Oral  SpO2: 95%  Weight: (!) 418 lb 1.6 oz (189.6 kg)  Height: 5\' 8"  (1.727 m)   Body mass index is 63.57 kg/m.  Nursing Note and Vital Signs reviewed.  Physical Exam  Constitutional: Patient appears well-developed and well-nourished. Obese. No distress.  HEENT: head atraumatic, normocephalic, pupils equal and reactive to light, Bilateral TM's without erythema or effusion,  bilateral maxillary and frontal sinuses are tender, neck supple  with submandibular lymphadenopathy, throat without tonsillar swelling or exudate; there is cobblestoning and PND present in the OP. Voice quality is nasal.  Cardiovascular: Normal rate, regular rhythm and normal heart sounds.  No murmur heard. No BLE edema. Pulmonary/Chest: Effort normal and breath sounds clear bilaterally. No respiratory distress. Psychiatric: Patient has a normal mood and affect. behavior is normal. Judgment and thought content normal.  No results found for this or any previous visit (from the past 72 hour(s)).  Assessment & Plan  1. Acute non-recurrent pansinusitis - Azelastine-Fluticasone 137-50 MCG/ACT SUSP; Place 1 spray into the nose daily.  Dispense: 23 g; Refill: 1 - guaiFENesin (MUCINEX) 600 MG 12 hr tablet; Take 1-2 tablets (600-1,200 mg total) by mouth 2 (two) times daily as needed for cough or to loosen phlegm.  Dispense: 30 tablet; Refill: 0 - doxycycline (VIBRA-TABS) 100 MG tablet; Take 1 tablet (100 mg total) by mouth 2 (two) times daily for 10 days.  Dispense: 20 tablet; Refill: 0 - If continuing to have blood in nasal drainage/sputum in 4-5 days, we will consider chest Xray.  -Red flags and when to present for emergency care or RTC including fever >101.5F, chest pain, shortness of breath, new/worsening/un-resolving symptoms, reviewed with patient at time of visit. Follow up and care instructions discussed and provided in AVS.

## 2018-02-16 MED ORDER — FLUTICASONE PROPIONATE 50 MCG/ACT NA SUSP: 2.0000 | Freq: Every day | NASAL | 3 refills | Status: DC

## 2018-02-16 NOTE — Addendum Note (Signed)
Addended by: Hubbard Hartshorn on: 02/16/2018 07:21 AM   Modules accepted: Orders

## 2018-02-26 ENCOUNTER — Other Ambulatory Visit (INDEPENDENT_AMBULATORY_CARE_PROVIDER_SITE_OTHER): Payer: Self-pay | Admitting: Physician Assistant

## 2018-02-26 DIAGNOSIS — E559 Vitamin D deficiency, unspecified: Secondary | ICD-10-CM

## 2018-02-27 ENCOUNTER — Ambulatory Visit: Payer: No Typology Code available for payment source | Admitting: Family Medicine

## 2018-02-28 ENCOUNTER — Encounter: Payer: No Typology Code available for payment source | Admitting: Family Medicine

## 2018-03-26 ENCOUNTER — Other Ambulatory Visit: Payer: Self-pay | Admitting: Family Medicine

## 2018-03-26 DIAGNOSIS — F330 Major depressive disorder, recurrent, mild: Secondary | ICD-10-CM

## 2018-03-26 NOTE — Telephone Encounter (Signed)
She needs an appointment , last seen by me in July

## 2018-03-26 NOTE — Telephone Encounter (Signed)
LVM for pt to call the office to schedule an appt. °

## 2018-04-06 ENCOUNTER — Other Ambulatory Visit: Payer: Self-pay | Admitting: Family Medicine

## 2018-04-06 DIAGNOSIS — E8881 Metabolic syndrome: Secondary | ICD-10-CM

## 2018-04-06 NOTE — Telephone Encounter (Signed)
Refill request for general medication: Trulicity   Last office visit: 02/15/2018  Last physical exam: 06/01/2016  Follow-ups on file. 05/07/2018

## 2018-04-19 ENCOUNTER — Other Ambulatory Visit (INDEPENDENT_AMBULATORY_CARE_PROVIDER_SITE_OTHER): Payer: Self-pay | Admitting: Physician Assistant

## 2018-04-19 ENCOUNTER — Other Ambulatory Visit: Payer: Self-pay | Admitting: Family Medicine

## 2018-04-19 DIAGNOSIS — F330 Major depressive disorder, recurrent, mild: Secondary | ICD-10-CM

## 2018-04-19 DIAGNOSIS — E559 Vitamin D deficiency, unspecified: Secondary | ICD-10-CM

## 2018-04-19 DIAGNOSIS — I10 Essential (primary) hypertension: Secondary | ICD-10-CM

## 2018-04-20 MED ORDER — ATENOLOL-CHLORTHALIDONE 50-25 MG PO TABS: 1.0000 | ORAL_TABLET | Freq: Every day | ORAL | 0 refills | Status: DC

## 2018-04-20 MED ORDER — BUPROPION HCL ER (XL) 300 MG PO TB24: 300.0000 mg | ORAL_TABLET | Freq: Every day | ORAL | 0 refills | Status: DC

## 2018-04-23 ENCOUNTER — Other Ambulatory Visit: Payer: Self-pay | Admitting: Family Medicine

## 2018-04-23 DIAGNOSIS — F330 Major depressive disorder, recurrent, mild: Secondary | ICD-10-CM

## 2018-05-07 ENCOUNTER — Ambulatory Visit: Payer: No Typology Code available for payment source | Admitting: Family Medicine

## 2018-05-27 ENCOUNTER — Other Ambulatory Visit: Payer: Self-pay | Admitting: Family Medicine

## 2018-05-27 DIAGNOSIS — F330 Major depressive disorder, recurrent, mild: Secondary | ICD-10-CM

## 2018-05-27 DIAGNOSIS — I10 Essential (primary) hypertension: Secondary | ICD-10-CM

## 2018-05-28 NOTE — Telephone Encounter (Signed)
lvm for pt to schedule a virtual appt.

## 2018-05-29 ENCOUNTER — Other Ambulatory Visit: Payer: Self-pay | Admitting: Family Medicine

## 2018-05-29 DIAGNOSIS — F330 Major depressive disorder, recurrent, mild: Secondary | ICD-10-CM

## 2018-05-29 DIAGNOSIS — I10 Essential (primary) hypertension: Secondary | ICD-10-CM

## 2018-05-30 ENCOUNTER — Ambulatory Visit (INDEPENDENT_AMBULATORY_CARE_PROVIDER_SITE_OTHER): Payer: No Typology Code available for payment source | Admitting: Family Medicine

## 2018-05-30 ENCOUNTER — Encounter: Payer: Self-pay | Admitting: Family Medicine

## 2018-05-30 ENCOUNTER — Other Ambulatory Visit: Payer: Self-pay

## 2018-05-30 VITALS — Temp 98.4°F

## 2018-05-30 DIAGNOSIS — Z6841 Body Mass Index (BMI) 40.0 and over, adult: Secondary | ICD-10-CM | POA: Insufficient documentation

## 2018-05-30 DIAGNOSIS — I10 Essential (primary) hypertension: Secondary | ICD-10-CM | POA: Diagnosis not present

## 2018-05-30 DIAGNOSIS — K5900 Constipation, unspecified: Secondary | ICD-10-CM

## 2018-05-30 DIAGNOSIS — E8881 Metabolic syndrome: Secondary | ICD-10-CM

## 2018-05-30 DIAGNOSIS — F330 Major depressive disorder, recurrent, mild: Secondary | ICD-10-CM

## 2018-05-30 MED ORDER — DOCUSATE SODIUM 100 MG PO CAPS: 100.0000 mg | ORAL_CAPSULE | Freq: Every day | ORAL | 0 refills | Status: DC

## 2018-05-30 MED ORDER — BUPROPION HCL ER (XL) 300 MG PO TB24: 300.0000 mg | ORAL_TABLET | Freq: Every day | ORAL | 1 refills | Status: DC

## 2018-05-30 MED ORDER — DULAGLUTIDE 1.5 MG/0.5ML ~~LOC~~ SOAJ: SUBCUTANEOUS | 1 refills | Status: DC

## 2018-05-30 MED ORDER — ATENOLOL-CHLORTHALIDONE 50-25 MG PO TABS: 1.0000 | ORAL_TABLET | Freq: Every day | ORAL | 1 refills | Status: DC

## 2018-05-30 MED ORDER — ESCITALOPRAM OXALATE 10 MG PO TABS: 10.0000 mg | ORAL_TABLET | Freq: Every day | ORAL | 1 refills | Status: DC

## 2018-05-30 NOTE — Progress Notes (Signed)
Name: Kimberly Cannon   MRN: [id number]    DOB: April 16, 1990   Date:05/30/2018       Progress Note  Subjective  Chief Complaint  Chief Complaint  Patient presents with  . Medication Refill    I connected with  Kimberly Cannon  on 05/30/18 at  8:00 AM EDT by a video enabled telemedicine application and verified that I am speaking with the correct person using two identifiers.  I discussed the limitations of evaluation and management by telemedicine and the availability of in person appointments. The patient expressed understanding and agreed to proceed. Staff also discussed with the patient that there may be a patient responsible charge related to this service. Patient Location: at home  Provider Location: [facility]   HPI  HTN: she has been taking medications as prescribed, no side effects. No chest pain, dizzinessor palpitation.BP is at goal over the past few visits when reviewing the chart.   Morbid Obesity: she has been obese all her life. Since May 2017 she has been working at [employer] and [employer] her diet, for protein shakes for breakfast and lunch , eating a regular meal at dinner,she started to exercise FEb 2017 at work, and had lost down to 378, butfelt very depressed thepast Summer of 2018worried about her mother and gained 31 lbs in 3 months and again another 59 lbsfromNov 2018 till Feb 2019.Shewas onMetformin for metabolic syndrome andwasnot working for her.We switched to Trulicity on 70/3500. She was going to the health weight management clinic in [place] but has not been back recently. She states last weight at home was 398 lbs at her grandmother's house about one month ago.  She is still following the weight loss plan and also walking her dog to exercise. Eating 1700 calories a day, and eating at least 100 g of protein daily   Acanthosis Nigricans:  She no longer has polydipsia and also  denies polyuria or polyphagia.She has PCOS and  other features of insulin resistance such as increase in abdominal girth and hypertension.She is currently on Trulicity.   Depression: she has history of depression when she was teenager but never treated. Her step father went to prison early Summer28mother got depressed and had other medical problems and she felt very sad, increase in appetite and weight gain, lack of motivation and it affected her sleep ( difficulty falling asleep), she states she is feeling a little better, but still not back to baseline, we started her on Lexapro Nov 2018 and phqwas unchanged, we added Wellbutrin Feb 2019 and she was feeling much better, but since COVID-19 and she has been feeling isolated and phq 9 is still 4 today . She feels like because she has more down time she keeps ruminating    Patient Active Problem List   Diagnosis Date Noted  . Class 3 severe obesity with serious comorbidity and body mass index (BMI) of 60.0 to 69.9 in adult ([place]) 05/30/2018  . Dermatofibroma 12/07/2017  . Left anterior knee pain 03/17/2017  . Lymphedema 08/04/2014  . Acanthosis nigricans 08/02/2014  . Benign essential HTN 08/02/2014  . Allergic contact dermatitis 08/02/2014  . Extreme obesity 08/02/2014  . Infrequent menses 08/02/2014  . Bilateral polycystic ovarian syndrome 08/02/2014  . Allergic rhinitis, seasonal 08/02/2014  . Vitamin D deficiency 10/12/2007    History reviewed. No pertinent surgical history.  Family History  Problem Relation Age of Onset  . High blood pressure Mother   . Thyroid disease Mother   .  Depression Mother   . Obesity Mother   . Obesity Father   . Sleep apnea Father   . Diabetes Paternal Uncle   . Diabetes Paternal Grandfather   . Cancer Paternal Grandmother     Social History   Socioeconomic History  . Marital status: Single    Spouse name: Not on file  . Number of children: 0  . Years of education: Not on file  . Highest education level: Associate degree:  occupational, Hotel manager, or vocational program  Occupational History  . Occupation: Actuary: [employer]  Social Needs  . Financial resource strain: Not hard at all  . Food insecurity:    Worry: Never true    Inability: Never true  . Transportation needs:    Medical: No    Non-medical: No  Tobacco Use  . Smoking status: Never Smoker  . Smokeless tobacco: Never Used  Substance and Sexual Activity  . Alcohol use: No    Alcohol/week: 0.0 standard drinks  . Drug use: No  . Sexual activity: Never  Lifestyle  . Physical activity:    Days per week: 3 days    Minutes per session: 30 min  . Stress: Only a little  Relationships  . Social connections:    Talks on phone: More than three times a week    Gets together: More than three times a week    Attends religious service: Never    Active member of club or organization: Yes    Attends meetings of clubs or organizations: 1 to 4 times per year    Relationship status: Never married  . Intimate partner violence:    Fear of current or ex partner: No    Emotionally abused: No    Physically abused: No    Forced sexual activity: No  Other Topics Concern  . Not on file  Social History Narrative   Lives with her mother and two younger sisters     Current Outpatient Medications:  .  atenolol-chlorthalidone (TENORETIC) 50-25 MG tablet, Take 1 tablet by mouth daily., Disp: 30 tablet, Rfl: 0 .  buPROPion (WELLBUTRIN XL) 300 MG 24 hr tablet, Take 1 tablet (300 mg total) by mouth daily., Disp: 30 tablet, Rfl: 0 .  D3-50 1.25 MG (50000 UT) capsule, , Disp: , Rfl: 0 .  escitalopram (LEXAPRO) 10 MG tablet, TAKE 1 TABLET (10 MG TOTAL) BY MOUTH DAILY., Disp: 30 tablet, Rfl: 0 .  TRULICITY 1.5 WY/6.3ZC SOPN, INJECT 1.5 MG INTO THE SKIN ONCE A WEEK., Disp: 6 mL, Rfl: 0  Allergies  Allergen Reactions  . Penicillins     I personally reviewed active problem list, medication list, allergies, family  history, social history, health maintenance with the patient/caregiver today.   ROS  Constitutional: Negative for fever or weight change.  Respiratory: Negative for cough and shortness of breath.   Cardiovascular: Negative for chest pain or palpitations.  Gastrointestinal: Negative for abdominal pain, positive for mild bowel changes.  Musculoskeletal: Negative for gait problem or joint swelling.  Skin: Negative for rash.  Neurological: Negative for dizziness or headache.  No other specific complaints in a complete review of systems (except as listed in HPI above).  Objective  Virtual encounter, vitals not obtained.  There is no height or weight on file to calculate BMI.  Physical Exam  Awake, alert, oriented, sitting comfortably at home  PHQ2/9: Depression screen [facility] 2/9 05/30/2018 02/15/2018 08/23/2017 06/27/2017 05/16/2017  Decreased Interest 1 1  0 1 1  Down, Depressed, Hopeless 1 0 0 1 0  PHQ - 2 Score 2 1 0 2 1  Altered sleeping 0 0 0 1 0  Tired, decreased energy 0 1 0 2 1  Change in appetite 1 1 0 1 1  Feeling bad or failure about yourself  1 1 0 2 0  Trouble concentrating 0 0 0 0 0  Moving slowly or fidgety/restless 0 0 0 0 0  Suicidal thoughts 0 0 0 0 0  PHQ-9 Score 4 4 0 8 3  Difficult doing work/chores Not difficult at all Somewhat difficult - Somewhat difficult Not difficult at all   PHQ-2/9 Result is positive.   Feeling more isolated because of COVID-19   Fall Risk: Fall Risk  05/30/2018 02/15/2018 08/23/2017 05/16/2017 03/17/2017  Falls in the past year? 0 0 No No No  Number falls in past yr: 0 0 - - -  Injury with Fall? 0 0 - - -     Assessment & Plan  1. Benign essential HTN  - atenolol-chlorthalidone (TENORETIC) 50-25 MG tablet; Take 1 tablet by mouth daily.  Dispense: 90 tablet; Refill: 1  2. Depression, major, recurrent, mild (HCC)  - buPROPion (WELLBUTRIN XL) 300 MG 24 hr tablet; Take 1 tablet (300 mg total) by mouth daily.  Dispense: 90 tablet; Refill: 1  - escitalopram (LEXAPRO) 10 MG tablet; Take 1 tablet (10 mg total) by mouth daily.  Dispense: 90 tablet; Refill: 1  3. Metabolic syndrome  - Dulaglutide (TRULICITY) 1.5 GY/1.8HU SOPN; INJECT 1.5 MG INTO THE SKIN ONCE A WEEK.  Dispense: 6 mL; Refill: 1  4. Class 3 severe obesity with serious comorbidity and body mass index (BMI) of 60.0 to 69.9 in adult, unspecified obesity type ([place])  Going to weight management center   I discussed the assessment and treatment plan with the patient. The patient was provided an opportunity to ask questions and all were answered. The patient agreed with the plan and demonstrated an understanding of the instructions.  The patient was advised to call back or seek an in-person evaluation if the symptoms worsen or if the condition fails to improve as anticipated.  I provided 25  minutes of non-face-to-face time during this encounter.

## 2018-06-20 ENCOUNTER — Encounter: Payer: Self-pay | Admitting: Family Medicine

## 2018-06-27 ENCOUNTER — Other Ambulatory Visit: Payer: Self-pay

## 2018-06-27 ENCOUNTER — Ambulatory Visit (INDEPENDENT_AMBULATORY_CARE_PROVIDER_SITE_OTHER): Payer: No Typology Code available for payment source | Admitting: Family Medicine

## 2018-06-27 ENCOUNTER — Encounter: Payer: Self-pay | Admitting: Family Medicine

## 2018-06-27 VITALS — BP 100/70 | HR 91 | Temp 98.2°F | Resp 16 | Ht 68.0 in | Wt >= 6400 oz

## 2018-06-27 DIAGNOSIS — E88819 Insulin resistance, unspecified: Secondary | ICD-10-CM

## 2018-06-27 DIAGNOSIS — E8881 Metabolic syndrome: Secondary | ICD-10-CM | POA: Diagnosis not present

## 2018-06-27 DIAGNOSIS — Z6841 Body Mass Index (BMI) 40.0 and over, adult: Secondary | ICD-10-CM | POA: Diagnosis not present

## 2018-06-27 MED ORDER — LIRAGLUTIDE -WEIGHT MANAGEMENT 18 MG/3ML ~~LOC~~ SOPN: 3.0000 mg | PEN_INJECTOR | Freq: Every day | SUBCUTANEOUS | 0 refills | Status: DC

## 2018-06-27 NOTE — Patient Instructions (Addendum)
Start medication tomorrow at 1.8 mg before breakfast, and second week go up to 2.4 and after that 3 mg daily   CAll us and ask for Ozempic is too expensive

## 2018-06-27 NOTE — Progress Notes (Signed)
Name: Kimberly Cannon   MRN: [id number]    DOB: [birth year]   Date:06/27/2018       Progress Note  Subjective  Chief Complaint  Chief Complaint  Patient presents with  . Medication Problem    Wants to discuss [place] with PCP.    HPI  Obesity morbid with insulin resistance: she [facility], however it got too expensive and she is now only on Trulicity but unable to lose weight. She states she has been skipping meals and when she eats she is very hungry and overeats. For example: she left work on Monday and was really hungry, she stopped on [employer] and ate 2 whoppers, and 8 chicken nuggets. When she finished felt full and realized she had eaten too much. She also states she was doing better when eating a high protein diet and had lost weight. Discussed importance of logging calories, knowing her triggers and exercise more. She failed Metformin and now Trulicity, her fasting insulin has been as high as 20. We will try Saxenda and monitor.    Patient Active Problem List   Diagnosis Date Noted  . Class 3 severe obesity with serious comorbidity and body mass index (BMI) of 60.0 to 69.9 in adult ([place]) 05/30/2018  . Dermatofibroma 12/07/2017  . Left anterior knee pain 03/17/2017  . Lymphedema 08/04/2014  . Acanthosis nigricans 08/02/2014  . Benign essential HTN 08/02/2014  . Allergic contact dermatitis 08/02/2014  . Extreme obesity 08/02/2014  . Infrequent menses 08/02/2014  . Bilateral polycystic ovarian syndrome 08/02/2014  . Allergic rhinitis, seasonal 08/02/2014  . Vitamin D deficiency 10/12/2007    History reviewed. No pertinent surgical history.  Family History  Problem Relation Age of Onset  . High blood pressure Mother   . Thyroid disease Mother   . Depression Mother   . Obesity Mother   . Obesity Father   . Sleep apnea Father   . Diabetes Paternal Uncle   . Diabetes Paternal Grandfather   . Cancer Paternal Grandmother      Social History   Socioeconomic History  . Marital status: Single    Spouse name: Not on file  . Number of children: 0  . Years of education: Not on file  . Highest education level: Associate degree: occupational, Hotel manager, or vocational program  Occupational History  . Occupation: Actuary: [employer]  Social Needs  . Financial resource strain: Not hard at all  . Food insecurity:    Worry: Never true    Inability: Never true  . Transportation needs:    Medical: No    Non-medical: No  Tobacco Use  . Smoking status: Never Smoker  . Smokeless tobacco: Never Used  Substance and Sexual Activity  . Alcohol use: No    Alcohol/week: 0.0 standard drinks  . Drug use: No  . Sexual activity: Never  Lifestyle  . Physical activity:    Days per week: 3 days    Minutes per session: 30 min  . Stress: Only a little  Relationships  . Social connections:    Talks on phone: More than three times a week    Gets together: More than three times a week    Attends religious service: Never    Active member of club or organization: Yes    Attends meetings of clubs or organizations: 1 to 4 times per year    Relationship status: Never married  .  Intimate partner violence:    Fear of current or ex partner: No    Emotionally abused: No    Physically abused: No    Forced sexual activity: No  Other Topics Concern  . Not on file  Social History Narrative   Lives with her mother and two younger sisters     Current Outpatient Medications:  .  atenolol-chlorthalidone (TENORETIC) 50-25 MG tablet, Take 1 tablet by mouth daily., Disp: 90 tablet, Rfl: 1 .  buPROPion (WELLBUTRIN XL) 300 MG 24 hr tablet, Take 1 tablet (300 mg total) by mouth daily., Disp: 90 tablet, Rfl: 1 .  Dulaglutide (TRULICITY) 1.5 ZO/1.0RU SOPN, INJECT 1.5 MG INTO THE SKIN ONCE A WEEK., Disp: 6 mL, Rfl: 1 .  escitalopram (LEXAPRO) 10 MG tablet, Take 1 tablet (10 mg total) by mouth  daily., Disp: 90 tablet, Rfl: 1 .  D3-50 1.25 MG (50000 UT) capsule, , Disp: , Rfl: 0 .  docusate sodium (COLACE) 100 MG capsule, Take 1 capsule (100 mg total) by mouth daily. (Patient not taking: Reported on 06/27/2018), Disp: 30 capsule, Rfl: 0  Allergies  Allergen Reactions  . Penicillins     I personally reviewed active problem list, medication list, allergies, family history, social history with the patient/caregiver today.   ROS  Constitutional: Negative for fever , positive for weight change.  Respiratory: Negative for cough and shortness of breath.   Cardiovascular: Negative for chest pain or palpitations.  Gastrointestinal: Negative for abdominal pain, no bowel changes.  Musculoskeletal: Negative for gait problem or joint swelling.  Skin: Negative for rash.  Neurological: Negative for dizziness or headache.  No other specific complaints in a complete review of systems (except as listed in HPI above).  Objective  Vitals:   06/27/18 1335  BP: 100/70  Pulse: 91  Resp: 16  Temp: 98.2 F (36.8 C)  TempSrc: Oral  SpO2: 96%  Weight: (!) 431 lb 11.2 oz (195.8 kg)  Height: 5\' 8"  (1.727 m)    Body mass index is 65.64 kg/m.  Physical Exam  Constitutional: Patient appears well-developed and well-nourished. Obese  No distress.  HEENT: head atraumatic, normocephalic, pupils equal and reactive to light, neck supple, throat within normal limits Cardiovascular: Normal rate, regular rhythm and normal heart sounds.  No murmur heard. No BLE edema. Pulmonary/Chest: Effort normal and breath sounds normal. No respiratory distress. Abdominal: Soft.  There is no tenderness. Psychiatric: Patient has a normal mood and affect. behavior is normal. Judgment and thought content normal.   PHQ2/9: Depression screen [facility] 2/9 06/27/2018 05/30/2018 02/15/2018 08/23/2017 06/27/2017  Decreased Interest 1 1 1  0 1  Down, Depressed, Hopeless 1 1 0 0 1  PHQ - 2 Score 2 2 1  0 2  Altered sleeping 0 0 0  0 1  Tired, decreased energy 1 0 1 0 2  Change in appetite 2 1 1  0 1  Feeling bad or failure about yourself  1 1 1  0 2  Trouble concentrating 0 0 0 0 0  Moving slowly or fidgety/restless 0 0 0 0 0  Suicidal thoughts 0 0 0 0 0  PHQ-9 Score 6 4 4  0 8  Difficult doing work/chores Not difficult at all Not difficult at all Somewhat difficult - Somewhat difficult  Some recent data might be hidden    phq 9 is positive   Fall Risk: Fall Risk  06/27/2018 05/30/2018 02/15/2018 08/23/2017 05/16/2017  Falls in the past year? 0 0 0 No No  Number falls in  past yr: 0 0 0 - -  Injury with Fall? 0 0 0 - -     Functional Status Survey: Is the patient deaf or have difficulty hearing?: No Does the patient have difficulty seeing, even when wearing glasses/contacts?: No Does the patient have difficulty concentrating, remembering, or making decisions?: No Does the patient have difficulty walking or climbing stairs?: No Does the patient have difficulty dressing or bathing?: No Does the patient have difficulty doing errands alone such as visiting a doctor's office or shopping?: No    Assessment & Plan  1. Class 3 severe obesity with serious comorbidity and body mass index (BMI) of 60.0 to 69.9 in adult, unspecified obesity type (HCC)  - Liraglutide -Weight Management (SAXENDA) 18 MG/3ML SOPN; Inject 3 mg into the skin daily.  Dispense: 15 mL; Refill: 0   2. Metabolic syndrome   3. Insulin resistance  - Liraglutide -Weight Management (SAXENDA) 18 MG/3ML SOPN; Inject 3 mg into the skin daily.  Dispense: 15 mL; Refill: 0

## 2018-06-29 ENCOUNTER — Ambulatory Visit: Payer: No Typology Code available for payment source | Admitting: Family Medicine

## 2018-07-05 ENCOUNTER — Encounter: Payer: Self-pay | Admitting: Family Medicine

## 2018-07-05 ENCOUNTER — Other Ambulatory Visit: Payer: Self-pay | Admitting: Family Medicine

## 2018-07-05 MED ORDER — NALTREXONE-BUPROPION HCL ER 8-90 MG PO TB12: 2.0000 | ORAL_TABLET | Freq: Two times a day (BID) | ORAL | 0 refills | Status: DC | PRN

## 2018-07-12 ENCOUNTER — Telehealth: Payer: Self-pay

## 2018-07-12 NOTE — Telephone Encounter (Signed)
Copied from [place] [phone] [phone]. Topic: General - Other >> Jul 11, 2018  3:47 PM Oneta Rack wrote: Osvaldo Human name: Sharyn Lull  Relation to pt: [facility]  Call back number: [phone]     Reason for call:  Appeal form faxing to [phone]  Form will be completed and faxed backed to their office for review.

## 2018-08-20 ENCOUNTER — Other Ambulatory Visit: Payer: Self-pay | Admitting: Family Medicine

## 2018-08-21 MED ORDER — CONTRAVE 8-90 MG PO TB12: 2.0000 | ORAL_TABLET | Freq: Two times a day (BID) | ORAL | 0 refills | Status: DC | PRN

## 2018-09-06 ENCOUNTER — Encounter: Payer: No Typology Code available for payment source | Admitting: Family Medicine

## 2018-09-30 ENCOUNTER — Encounter: Payer: Self-pay | Admitting: Family Medicine

## 2018-10-01 ENCOUNTER — Other Ambulatory Visit: Payer: Self-pay | Admitting: Family Medicine

## 2018-10-01 MED ORDER — BUPROPION HCL ER (XL) 150 MG PO TB24: 150.0000 mg | ORAL_TABLET | Freq: Every day | ORAL | 1 refills | Status: DC

## 2018-10-15 ENCOUNTER — Telehealth: Payer: Self-pay | Admitting: Family Medicine

## 2018-10-15 ENCOUNTER — Encounter: Payer: No Typology Code available for payment source | Admitting: Family Medicine

## 2018-10-15 DIAGNOSIS — I10 Essential (primary) hypertension: Secondary | ICD-10-CM

## 2018-10-15 DIAGNOSIS — F330 Major depressive disorder, recurrent, mild: Secondary | ICD-10-CM

## 2018-10-15 NOTE — Telephone Encounter (Signed)
Medication Refill - Medication:  buPROPion (WELLBUTRIN XL) 150 MG 24 hr tablet atenolol-chlorthalidone (TENORETIC) 50-25 MG tablet escitalopram (LEXAPRO) 10 MG tablet   Has the patient contacted their pharmacy? Yes pharmacy called in.  Preferred Pharmacy (with phone number or street name):  [facility] [phone] (phone) [phone] (fax)  Agent: Please be advised that RX refills may take up to 3 business days. We ask that you follow-up with your pharmacy.

## 2018-10-16 NOTE — Telephone Encounter (Signed)
Copied from [place] [id number]. Topic: General - Call Back - No Documentation >> Oct 15, 2018  4:00 PM Reyne Dumas L wrote: Reason for CRM:  Pt states she is returning a call to Dr. Ancil Boozer assistant.

## 2018-10-16 NOTE — Telephone Encounter (Signed)
Called to see if patient had enough medication until her refill with Dr. Ancil Boozer on 11/29/2018

## 2018-10-18 ENCOUNTER — Other Ambulatory Visit: Payer: Self-pay

## 2018-10-18 DIAGNOSIS — I10 Essential (primary) hypertension: Secondary | ICD-10-CM

## 2018-10-18 DIAGNOSIS — F330 Major depressive disorder, recurrent, mild: Secondary | ICD-10-CM

## 2018-10-18 MED ORDER — ESCITALOPRAM OXALATE 10 MG PO TABS: 10.0000 mg | ORAL_TABLET | Freq: Every day | ORAL | 0 refills | Status: DC

## 2018-10-18 MED ORDER — ATENOLOL-CHLORTHALIDONE 50-25 MG PO TABS: 1.0000 | ORAL_TABLET | Freq: Every day | ORAL | 0 refills | Status: DC

## 2018-10-18 MED ORDER — BUPROPION HCL ER (XL) 150 MG PO TB24: 150.0000 mg | ORAL_TABLET | Freq: Every day | ORAL | 0 refills | Status: DC

## 2018-10-27 ENCOUNTER — Encounter: Payer: Self-pay | Admitting: Family Medicine

## 2018-11-29 ENCOUNTER — Ambulatory Visit: Payer: No Typology Code available for payment source | Admitting: Family Medicine

## 2018-12-26 ENCOUNTER — Other Ambulatory Visit: Payer: Self-pay | Admitting: Family Medicine

## 2018-12-26 DIAGNOSIS — F330 Major depressive disorder, recurrent, mild: Secondary | ICD-10-CM

## 2018-12-26 DIAGNOSIS — I10 Essential (primary) hypertension: Secondary | ICD-10-CM

## 2018-12-27 ENCOUNTER — Other Ambulatory Visit: Payer: Self-pay | Admitting: Family Medicine

## 2018-12-27 ENCOUNTER — Encounter: Payer: Self-pay | Admitting: Family Medicine

## 2018-12-27 DIAGNOSIS — F330 Major depressive disorder, recurrent, mild: Secondary | ICD-10-CM

## 2018-12-27 DIAGNOSIS — I10 Essential (primary) hypertension: Secondary | ICD-10-CM

## 2018-12-27 MED ORDER — ATENOLOL-CHLORTHALIDONE 50-25 MG PO TABS: 1.0000 | ORAL_TABLET | Freq: Every day | ORAL | 0 refills | Status: DC

## 2018-12-27 NOTE — Telephone Encounter (Signed)
lvm for pt to schedule appt °

## 2018-12-27 NOTE — Telephone Encounter (Signed)
Refill request for Wellbutrin 150 mg and lexapro 10 mg. Pt needs to have a PHQ 2 or PHQ9 completed. Next office visit is 12/804/2020.

## 2018-12-27 NOTE — Telephone Encounter (Signed)
Pt given 30- tabs until appointment on 01/18/2019. Also need labs.

## 2018-12-27 NOTE — Telephone Encounter (Signed)
Pt has scheduled appt, called back to report that she is out of all listed meds. Please advise    Refills have been requested for the following medications:      atenolol-chlorthalidone (TENORETIC) 50-25 MG tablet Loistine Chance, MD]      buPROPion (WELLBUTRIN XL) 150 MG 24 hr tablet Loistine Chance, MD]      escitalopram (LEXAPRO) 10 MG tablet Loistine Chance, MD]  Preferred pharmacy: [facility], [place]

## 2018-12-28 ENCOUNTER — Ambulatory Visit (INDEPENDENT_AMBULATORY_CARE_PROVIDER_SITE_OTHER): Payer: No Typology Code available for payment source | Admitting: Family Medicine

## 2018-12-28 ENCOUNTER — Other Ambulatory Visit: Payer: Self-pay

## 2018-12-28 ENCOUNTER — Encounter: Payer: Self-pay | Admitting: Family Medicine

## 2018-12-28 VITALS — BP 140/100 | HR 68 | Temp 97.3°F | Resp 16 | Ht 68.0 in | Wt >= 6400 oz

## 2018-12-28 DIAGNOSIS — I10 Essential (primary) hypertension: Secondary | ICD-10-CM

## 2018-12-28 DIAGNOSIS — F330 Major depressive disorder, recurrent, mild: Secondary | ICD-10-CM

## 2018-12-28 DIAGNOSIS — E8881 Metabolic syndrome: Secondary | ICD-10-CM

## 2018-12-28 DIAGNOSIS — E559 Vitamin D deficiency, unspecified: Secondary | ICD-10-CM | POA: Diagnosis not present

## 2018-12-28 DIAGNOSIS — Z13220 Encounter for screening for lipoid disorders: Secondary | ICD-10-CM

## 2018-12-28 DIAGNOSIS — Z6841 Body Mass Index (BMI) 40.0 and over, adult: Secondary | ICD-10-CM

## 2018-12-28 MED ORDER — BUPROPION HCL ER (XL) 150 MG PO TB24: 150.0000 mg | ORAL_TABLET | Freq: Every day | ORAL | 0 refills | Status: DC

## 2018-12-28 MED ORDER — NOVOFINE 32G X 6 MM MISC: 1.0000 | Freq: Every day | 0 refills | Status: DC

## 2018-12-28 MED ORDER — ESCITALOPRAM OXALATE 10 MG PO TABS: 10.0000 mg | ORAL_TABLET | Freq: Every day | ORAL | 0 refills | Status: DC

## 2018-12-28 MED ORDER — SAXENDA 18 MG/3ML ~~LOC~~ SOPN: 3.0000 mg | PEN_INJECTOR | Freq: Every day | SUBCUTANEOUS | 0 refills | Status: DC

## 2018-12-28 NOTE — Addendum Note (Signed)
Addended by: Steele Sizer F on: 12/28/2018 02:53 PM   Modules accepted: Orders

## 2018-12-28 NOTE — Progress Notes (Signed)
Name: Kimberly Cannon   MRN: [id number]    DOB: 12-16-1990   Date:12/28/2018       Progress Note  Subjective  Chief Complaint  Chief Complaint  Patient presents with  . Medication Refill    HPI  HTN: she has been taking medications as prescribed, no side effects. No chest pain, dizzinessor palpitation.She ran out of medication and did not take it today, bp is high and advised her to return for bp check with CMA next week   Morbid Obesity: she has been obese all her life. Since May 2017 she has been working at [employer] and [employer] her diet, for protein shakes for breakfast and lunch , eating a regular meal at dinner,she started to exercise Feb 2017 at work, and had lost down to 378, butfelt very depressed thepast Summer of 2018worried about her mother and gained 31 lbs in 3 months and another 42 lbsfromNov 2018 till Feb 2019.Shewas onMetformin for metabolic syndrome andwasnot working for her.We switched to Trulicity on [id number]. She was going to the health weight management clinic in [place] but stopped going because of cost. She states she is not doing well with her diet , she is working long hours, more stressed because they are short staffed. She has been skipping lunch, eating fast food at lunch and over-eating when she gets home at night. We tried Contrave but she failed therapy, we gave [place] but denied by insurance because she had to take contrave but she is willing to try it again. Discussed bariatric surgery and she will think about it , we will try sending Saxenda. She denies family history of thyroid cancer, no personal history of pancreatitis, discussed side effects  Acanthosis Nigricans:  She no longer has polydipsia and also  denies polyuria or polyphagia.She has PCOS and other features of insulin resistance such as increase in abdominal girth and hypertension.She is off Trulicity at this time  Depression: she has history of depression when she was  teenager but never treated. Her step father went to prison early Summer279mother got depressed and had other medical problems and she felt very sad, increase in appetite and weight gain, lack of motivation and it affected her sleep ( difficulty falling asleep), she improved for a while with medication, but she states she is having difficulty staying asleep, she has been tired - but not sure if secondary to working long hours. She is on   Lexapro since Nov 2018 and we added Wellbutrin Feb 2019 and she was feeling much better, but since COVID-19 and she has been feeling isolated and phq 9 is still 6 today.  Patient Active Problem List   Diagnosis Date Noted  . Class 3 severe obesity with serious comorbidity and body mass index (BMI) of 60.0 to 69.9 in adult ([place]) 05/30/2018  . Dermatofibroma 12/07/2017  . Left anterior knee pain 03/17/2017  . Lymphedema 08/04/2014  . Acanthosis nigricans 08/02/2014  . Benign essential HTN 08/02/2014  . Allergic contact dermatitis 08/02/2014  . Extreme obesity 08/02/2014  . Infrequent menses 08/02/2014  . Bilateral polycystic ovarian syndrome 08/02/2014  . Allergic rhinitis, seasonal 08/02/2014  . Vitamin D deficiency 10/12/2007    History reviewed. No pertinent surgical history.  Family History  Problem Relation Age of Onset  . High blood pressure Mother   . Thyroid disease Mother   . Depression Mother   . Obesity Mother   . Obesity Father   . Sleep apnea Father   . Diabetes Paternal Uncle   .  Diabetes Paternal Grandfather   . Cancer Paternal Grandmother     Social History   Socioeconomic History  . Marital status: Single    Spouse name: Not on file  . Number of children: 0  . Years of education: Not on file  . Highest education level: Associate degree: occupational, Hotel manager, or vocational program  Occupational History  . Occupation: Actuary: [employer]  Social Needs  . Financial resource  strain: Not hard at all  . Food insecurity    Worry: Never true    Inability: Never true  . Transportation needs    Medical: No    Non-medical: No  Tobacco Use  . Smoking status: Never Smoker  . Smokeless tobacco: Never Used  Substance and Sexual Activity  . Alcohol use: No    Alcohol/week: 0.0 standard drinks  . Drug use: No  . Sexual activity: Never  Lifestyle  . Physical activity    Days per week: 3 days    Minutes per session: 30 min  . Stress: Only a little  Relationships  . Social connections    Talks on phone: More than three times a week    Gets together: More than three times a week    Attends religious service: Never    Active member of club or organization: Yes    Attends meetings of clubs or organizations: 1 to 4 times per year    Relationship status: Never married  . Intimate partner violence    Fear of current or ex partner: No    Emotionally abused: No    Physically abused: No    Forced sexual activity: No  Other Topics Concern  . Not on file  Social History Narrative   Lives with her mother and two younger sisters     Current Outpatient Medications:  .  atenolol-chlorthalidone (TENORETIC) 50-25 MG tablet, Take 1 tablet by mouth daily., Disp: 30 tablet, Rfl: 0 .  buPROPion (WELLBUTRIN XL) 150 MG 24 hr tablet, Take 1 tablet (150 mg total) by mouth daily., Disp: 90 tablet, Rfl: 0 .  D3-50 1.25 MG (50000 UT) capsule, , Disp: , Rfl: 0 .  escitalopram (LEXAPRO) 10 MG tablet, Take 1 tablet (10 mg total) by mouth daily., Disp: 90 tablet, Rfl: 0 .  Liraglutide -Weight Management (SAXENDA) 18 MG/3ML SOPN, Inject 3 mg into the skin daily. (Patient not taking: Reported on 12/28/2018), Disp: 15 mL, Rfl: 0  Allergies  Allergen Reactions  . Penicillins     I personally reviewed active problem list, medication list, allergies, family history, social history, health maintenance with the patient/caregiver today.   ROS  Constitutional: Negative for fever,  positive weight change.  Respiratory: Negative for cough and shortness of breath.   Cardiovascular: Negative for chest pain or palpitations.  Gastrointestinal: Negative for abdominal pain, no bowel changes.  Musculoskeletal: Negative for gait problem or joint swelling.  Skin: Negative for rash.  Neurological: Negative for dizziness or headache.  No other specific complaints in a complete review of systems (except as listed in HPI above).   Objective  Vitals:   12/28/18 1340  BP: (!) 140/100  Pulse: 68  Resp: 16  Temp: (!) 97.3 F (36.3 C)  TempSrc: Temporal  SpO2: 96%  Weight: (!) 453 lb (205.5 kg)  Height: 5\' 8"  (1.727 m)    Body mass index is 68.88 kg/m.  Physical Exam  Constitutional: Patient appears well-developed and well-nourished. Obese  No distress.  HEENT: head atraumatic, normocephalic, pupils equal and reactive to light Cardiovascular: Normal rate, regular rhythm and normal heart sounds.  No murmur heard. No BLE edema. Pulmonary/Chest: Effort normal and breath sounds normal. No respiratory distress. Abdominal: Soft.  There is no tenderness. Psychiatric: Patient has a normal mood and affect. behavior is normal. Judgment and thought content normal.  PHQ2/9: Depression screen [facility] 2/9 12/28/2018 06/27/2018 05/30/2018 02/15/2018 08/23/2017  Decreased Interest 1 1 1 1  0  Down, Depressed, Hopeless 1 1 1  0 0  PHQ - 2 Score 2 2 2 1  0  Altered sleeping 1 0 0 0 0  Tired, decreased energy 1 1 0 1 0  Change in appetite 2 2 1 1  0  Feeling bad or failure about yourself  0 1 1 1  0  Trouble concentrating 0 0 0 0 0  Moving slowly or fidgety/restless 0 0 0 0 0  Suicidal thoughts 0 0 0 0 0  PHQ-9 Score 6 6 4 4  0  Difficult doing work/chores Somewhat difficult Not difficult at all Not difficult at all Somewhat difficult -  Some recent data might be hidden    phq 9 is positive   Fall Risk: Fall Risk  12/28/2018 06/27/2018 05/30/2018 02/15/2018 08/23/2017  Falls in the past year?  0 0 0 0 No  Number falls in past yr: 0 0 0 0 -  Injury with Fall? 0 0 0 0 -     Functional Status Survey: Is the patient deaf or have difficulty hearing?: No Does the patient have difficulty seeing, even when wearing glasses/contacts?: No Does the patient have difficulty concentrating, remembering, or making decisions?: No Does the patient have difficulty walking or climbing stairs?: No Does the patient have difficulty dressing or bathing?: No Does the patient have difficulty doing errands alone such as visiting a doctor's office or shopping?: No    Assessment & Plan  1. Benign essential HTN  - CBC with Differential/Platelet - COMPLETE METABOLIC PANEL WITH GFR  2. Class 3 severe obesity with serious comorbidity and body mass index (BMI) of 60.0 to 69.9 in adult, unspecified obesity type (HCC)  - Liraglutide -Weight Management (SAXENDA) 18 MG/3ML SOPN; Inject 3 mg into the skin daily.  Dispense: 15 mL; Refill: 0 - Insulin Pen Needle (NOVOFINE) 32G X 6 MM MISC; 1 each by Does not apply route daily.  Dispense: 100 each; Refill: 0  3. Insulin resistance  - Liraglutide -Weight Management (SAXENDA) 18 MG/3ML SOPN; Inject 3 mg into the skin daily.  Dispense: 15 mL; Refill: 0 - Hemoglobin A1c - Insulin, Free (Bioactive)  4. Metabolic syndrome  Discussed life style modification   5. Vitamin D deficiency  Advised to take otc vitamin D   6. Lipid screening  - Lipid panel

## 2019-01-02 LAB — CBC WITH DIFFERENTIAL/PLATELET
Absolute Monocytes: 507 cells/uL (ref 200–950)
Basophils Absolute: 23 cells/uL (ref 0–200)
Basophils Relative: 0.3 %
Eosinophils Absolute: 218 cells/uL (ref 15–500)
Eosinophils Relative: 2.8 %
HCT: 38.6 % (ref 35.0–45.0)
Hemoglobin: 12.8 g/dL (ref 11.7–15.5)
Lymphs Abs: 3994 cells/uL — ABNORMAL HIGH (ref 850–3900)
MCH: 27.6 pg (ref 27.0–33.0)
MCHC: 33.2 g/dL (ref 32.0–36.0)
MCV: 83.4 fL (ref 80.0–100.0)
MPV: 9.9 fL (ref 7.5–12.5)
Monocytes Relative: 6.5 %
Neutro Abs: 3058 cells/uL (ref 1500–7800)
Neutrophils Relative %: 39.2 %
Platelets: 372 10*3/uL (ref 140–400)
RBC: 4.63 10*6/uL (ref 3.80–5.10)
RDW: 13.7 % (ref 11.0–15.0)
Total Lymphocyte: 51.2 %
WBC: 7.8 10*3/uL (ref 3.8–10.8)

## 2019-01-02 LAB — COMPLETE METABOLIC PANEL WITH GFR
AG Ratio: 1 (calc) (ref 1.0–2.5)
ALT: 25 U/L (ref 6–29)
AST: 21 U/L (ref 10–30)
Albumin: 3.7 g/dL (ref 3.6–5.1)
Alkaline phosphatase (APISO): 81 U/L (ref 31–125)
BUN: 12 mg/dL (ref 7–25)
CO2: 25 mmol/L (ref 20–32)
Calcium: 9 mg/dL (ref 8.6–10.2)
Chloride: 103 mmol/L (ref 98–110)
Creat: 0.57 mg/dL (ref 0.50–1.10)
GFR, Est African American: 145 mL/min/{1.73_m2} (ref >=60)
GFR, Est Non African American: 125 mL/min/{1.73_m2} (ref >=60)
Globulin: 3.6 g/dL (calc) (ref 1.9–3.7)
Glucose, Bld: 79 mg/dL (ref 65–99)
Potassium: 3.4 mmol/L — ABNORMAL LOW (ref 3.5–5.3)
Sodium: 138 mmol/L (ref 135–146)
Total Bilirubin: 0.3 mg/dL (ref 0.2–1.2)
Total Protein: 7.3 g/dL (ref 6.1–8.1)

## 2019-01-02 LAB — LIPID PANEL
Cholesterol: 177 mg/dL (ref <200)
HDL: 52 mg/dL (ref >=50)
LDL Cholesterol (Calc): 112 mg/dL (calc) — ABNORMAL HIGH
Non-HDL Cholesterol (Calc): 125 mg/dL (calc) (ref <130)
Total CHOL/HDL Ratio: 3.4 (calc) (ref <5.0)
Triglycerides: 52 mg/dL (ref <150)

## 2019-01-02 LAB — HEMOGLOBIN A1C
Hgb A1c MFr Bld: 5.5 % of total Hgb (ref <5.7)
Mean Plasma Glucose: 111 (calc)
eAG (mmol/L): 6.2 (calc)

## 2019-01-02 LAB — INSULIN, FREE (BIOACTIVE): Insulin, Free: 11 u[IU]/mL (ref 1.5–14.9)

## 2019-01-18 ENCOUNTER — Ambulatory Visit: Payer: No Typology Code available for payment source | Admitting: Family Medicine

## 2019-01-28 ENCOUNTER — Other Ambulatory Visit: Payer: Self-pay | Admitting: Family Medicine

## 2019-01-28 DIAGNOSIS — I10 Essential (primary) hypertension: Secondary | ICD-10-CM

## 2019-02-06 ENCOUNTER — Ambulatory Visit: Payer: No Typology Code available for payment source | Admitting: Family Medicine

## 2019-02-12 ENCOUNTER — Encounter: Payer: Self-pay | Admitting: Family Medicine

## 2019-02-13 ENCOUNTER — Other Ambulatory Visit: Payer: Self-pay | Admitting: Family Medicine

## 2019-02-13 MED ORDER — BUPROPION HCL ER (XL) 300 MG PO TB24: 300.0000 mg | ORAL_TABLET | Freq: Every day | ORAL | 0 refills | Status: DC

## 2019-02-20 ENCOUNTER — Other Ambulatory Visit: Payer: Self-pay | Admitting: Family Medicine

## 2019-02-20 DIAGNOSIS — E8881 Metabolic syndrome: Secondary | ICD-10-CM

## 2019-02-21 MED ORDER — SAXENDA 18 MG/3ML ~~LOC~~ SOPN: 3.0000 mg | PEN_INJECTOR | Freq: Every day | SUBCUTANEOUS | 0 refills | Status: DC

## 2019-02-26 ENCOUNTER — Other Ambulatory Visit: Payer: Self-pay

## 2019-02-26 ENCOUNTER — Ambulatory Visit (INDEPENDENT_AMBULATORY_CARE_PROVIDER_SITE_OTHER): Payer: No Typology Code available for payment source | Admitting: Family Medicine

## 2019-02-26 ENCOUNTER — Encounter: Payer: Self-pay | Admitting: Family Medicine

## 2019-02-26 VITALS — BP 118/80 | HR 88 | Temp 96.9°F | Resp 16 | Ht 68.0 in | Wt >= 6400 oz

## 2019-02-26 DIAGNOSIS — I10 Essential (primary) hypertension: Secondary | ICD-10-CM | POA: Diagnosis not present

## 2019-02-26 DIAGNOSIS — Z6841 Body Mass Index (BMI) 40.0 and over, adult: Secondary | ICD-10-CM

## 2019-02-26 DIAGNOSIS — E8881 Metabolic syndrome: Secondary | ICD-10-CM

## 2019-02-26 DIAGNOSIS — F330 Major depressive disorder, recurrent, mild: Secondary | ICD-10-CM | POA: Diagnosis not present

## 2019-02-26 MED ORDER — BUPROPION HCL ER (XL) 300 MG PO TB24: 300.0000 mg | ORAL_TABLET | Freq: Every day | ORAL | 0 refills | Status: DC

## 2019-02-26 MED ORDER — SAXENDA 18 MG/3ML ~~LOC~~ SOPN: 3.0000 mg | PEN_INJECTOR | Freq: Every day | SUBCUTANEOUS | 1 refills | Status: DC

## 2019-02-26 MED ORDER — ATENOLOL-CHLORTHALIDONE 50-25 MG PO TABS: 1.0000 | ORAL_TABLET | Freq: Every day | ORAL | 0 refills | Status: DC

## 2019-02-26 MED ORDER — ESCITALOPRAM OXALATE 10 MG PO TABS: 10.0000 mg | ORAL_TABLET | Freq: Every day | ORAL | 0 refills | Status: DC

## 2019-02-26 NOTE — Progress Notes (Signed)
Name: Kimberly Cannon   MRN: [id number]    DOB: 09-Aug-1990   Date:02/26/2019       Progress Note  Subjective  Chief Complaint  Chief Complaint  Patient presents with  . Follow-up    6 week F/U  . Hypertension  . Class 3 severe obesity with serious comorbidity and body mas  . Insulin resistance    HPI  HTN: she has been taking medications as prescribed, no side effects. No chest pain, dizzinessor palpitation.BP is back to normal range since resumed medication, she was out of medication on her last visit   Morbid Obesity: she has been obese all her life. Since May 2017 she has been working at [facility] [employer] and [employer] her diet, for protein shakes for breakfast and lunch , eating a regular meal at dinner,she started to exercise Feb 2017 at work, and had lost down to 378, butfelt very depressed thepast Summer of 2018worried about her mother and gained 31 lbs in 3 months and another 50 lbsfromNov 2018 till Feb 2019.Shewas onMetformin for metabolic syndrome andwasnot working for her.We switched to Trulicity on [id number].  We tried Contrave but she failed therapy, we gave [place] but denied by insurance because she had to take contrave but she is willing to try it again. Discussed bariatric surgery and she will think about it , she is on Saxenda and she feels like it is helping curb her appetite. She is no longer skipping lunch and has been eating less when she gets home from work. Weight is up 6 lbs since Nov, but just recently got to the full dose of Saxenda and also had the holidays. We will give her 3-4 months to lose the 5-10 % of her original weight of 453 lbs Nov 2020.    Depression: she has history of depression when she was teenager but never treated. Her step father went to prison early Summer269mother got depressed and had other medical problems and she felt very sad, increase in appetite and weight gain, lack of motivation and it affected her sleep ( difficulty falling  asleep), she improved for a while with medication, but she states she is having difficulty staying asleep, she has been tired - but not sure if secondary to working long hours. She is on   Lexapro since Nov 2018 and we added Wellbutrin Feb 2019 andshe was feeling much better, but since COVID-19 and she has been feeling isolated also states short staffed at work, feels overwhelmed and tired at times. Phq 9 is slightly better but still positive . Discussed love and kindness meditation   Patient Active Problem List   Diagnosis Date Noted  . Class 3 severe obesity with serious comorbidity and body mass index (BMI) of 60.0 to 69.9 in adult ([place]) 05/30/2018  . Dermatofibroma 12/07/2017  . Left anterior knee pain 03/17/2017  . Lymphedema 08/04/2014  . Acanthosis nigricans 08/02/2014  . Benign essential HTN 08/02/2014  . Allergic contact dermatitis 08/02/2014  . Extreme obesity 08/02/2014  . Infrequent menses 08/02/2014  . Bilateral polycystic ovarian syndrome 08/02/2014  . Allergic rhinitis, seasonal 08/02/2014  . Vitamin D deficiency 10/12/2007    History reviewed. No pertinent surgical history.  Family History  Problem Relation Age of Onset  . High blood pressure Mother   . Thyroid disease Mother   . Depression Mother   . Obesity Mother   . Obesity Father   . Sleep apnea Father   . Diabetes Paternal Uncle   . Diabetes Paternal Grandfather   .  Cancer Paternal Grandmother       Current Outpatient Medications:  .  atenolol-chlorthalidone (TENORETIC) 50-25 MG tablet, TAKE 1 TABLET BY MOUTH DAILY., Disp: 30 tablet, Rfl: 0 .  buPROPion (WELLBUTRIN XL) 300 MG 24 hr tablet, Take 1 tablet (300 mg total) by mouth daily., Disp: 90 tablet, Rfl: 0 .  Cholecalciferol (VITAMIN D) 50 MCG (2000 UT) CAPS, Take 2,000 Units by mouth daily., Disp: , Rfl:  .  escitalopram (LEXAPRO) 10 MG tablet, Take 1 tablet (10 mg total) by mouth daily., Disp: 90 tablet, Rfl: 0 .  Liraglutide -Weight Management  (SAXENDA) 18 MG/3ML SOPN, Inject 3 mg into the skin daily., Disp: 5 mL, Rfl: 0 .  D3-50 1.25 MG (50000 UT) capsule, , Disp: , Rfl: 0 .  Insulin Pen Needle (NOVOFINE) 32G X 6 MM MISC, 1 each by Does not apply route daily., Disp: 100 each, Rfl: 0  Allergies  Allergen Reactions  . Penicillins     I personally reviewed active problem list, medication list, allergies, family history, social history with the patient/careiver today.   ROS  Constitutional: Negative for fever or significant weight change.  Respiratory: Negative for cough and shortness of breath.   Cardiovascular: Negative for chest pain or palpitations.  Gastrointestinal: Negative for abdominal pain, no bowel changes.  Musculoskeletal: Negative for gait problem or joint swelling.  Skin: Negative for rash.  Neurological: Negative for dizziness or headache.  No other specific complaints in a complete review of systems (except as listed in HPI above).  Objective  Vitals:   02/26/19 1535  BP: 118/80  Pulse: 88  Resp: 16  Temp: (!) 96.9 F (36.1 C)  TempSrc: Temporal  SpO2: 95%  Weight: (!) 459 lb (208.2 kg)  Height: 5\' 8"  (1.727 m)    Body mass index is 69.79 kg/m.  Physical Exam  Constitutional: Patient appears well-developed and well-nourished. Obese  No distress.  HEENT: head atraumatic, normocephalic, pupils equal and reactive to light Cardiovascular: Normal rate, regular rhythm and normal heart sounds.  No murmur heard. No BLE edema. Pulmonary/Chest: Effort normal and breath sounds normal. No respiratory distress. Abdominal: Soft.  There is no tenderness. Psychiatric: Patient has a normal mood and affect. behavior is normal. Judgment and thought content normal.  Recent Results (from the past 2160 hour(s))  Lipid panel     Status: Abnormal   Collection Time: 12/28/18 12:00 AM  Result Value Ref Range   Cholesterol 177 <200 mg/dL   HDL 52 > OR = 50 mg/dL   Triglycerides 52 <150 mg/dL   LDL Cholesterol  (Calc) 112 (H) mg/dL (calc)    Comment: Reference range: <100 . Desirable range <100 mg/dL for primary prevention;   <70 mg/dL for patients with CHD or diabetic patients  with > or = 2 CHD risk factors. [name] LDL-C is now calculated using the Martin-Hopkins  calculation, which is a validated novel method providing  better accuracy than the Friedewald equation in the  estimation of LDL-C.  Cresenciano Genre et al. Annamaria Helling. [id number]): 2061-2068  (http://education.[facility].com/faq/FAQ164)    Total CHOL/HDL Ratio 3.4 <5.0 (calc)   Non-HDL Cholesterol (Calc) 125 <130 mg/dL (calc)    Comment: For patients with diabetes plus 1 major ASCVD risk  factor, treating to a non-HDL-C goal of <100 mg/dL  (LDL-C of <70 mg/dL) is considered a therapeutic  option.   Hemoglobin A1c     Status: None   Collection Time: 12/28/18 12:00 AM  Result Value Ref Range   Hgb A1c  MFr Bld 5.5 <5.7 % of total Hgb    Comment: For the purpose of screening for the presence of diabetes: . <5.7%       Consistent with the absence of diabetes 5.7-6.4%    Consistent with increased risk for diabetes             (prediabetes) > or =6.5%  Consistent with diabetes . This assay result is consistent with a decreased risk of diabetes. . Currently, no consensus exists regarding use of hemoglobin A1c for diagnosis of diabetes in children. . According to American Diabetes Association (ADA) guidelines, hemoglobin A1c <7.0% represents optimal control in non-pregnant diabetic patients. Different metrics may apply to specific patient populations.  Standards of Medical Care in Diabetes(ADA). .    Mean Plasma Glucose 111 (calc)   eAG (mmol/L) 6.2 (calc)  CBC with Differential/Platelet     Status: Abnormal   Collection Time: 12/28/18 12:00 AM  Result Value Ref Range   WBC 7.8 3.8 - 10.8 Thousand/uL   RBC 4.63 3.80 - 5.10 Million/uL   Hemoglobin 12.8 11.7 - 15.5 g/dL   HCT 38.6 35.0 - 45.0 %   MCV 83.4 80.0 - 100.0 fL   MCH  27.6 27.0 - 33.0 pg   MCHC 33.2 32.0 - 36.0 g/dL   RDW 13.7 11.0 - 15.0 %   Platelets 372 140 - 400 Thousand/uL   MPV 9.9 7.5 - 12.5 fL   Neutro Abs 3,058 1,500 - 7,800 cells/uL   Lymphs Abs 3,994 (H) 850 - 3,900 cells/uL   Absolute Monocytes 507 200 - 950 cells/uL   Eosinophils Absolute 218 15 - 500 cells/uL   Basophils Absolute 23 0 - 200 cells/uL   Neutrophils Relative % 39.2 %   Total Lymphocyte 51.2 %   Monocytes Relative 6.5 %   Eosinophils Relative 2.8 %   Basophils Relative 0.3 %  COMPLETE METABOLIC PANEL WITH GFR     Status: Abnormal   Collection Time: 12/28/18 12:00 AM  Result Value Ref Range   Glucose, Bld 79 65 - 99 mg/dL    Comment: .            Fasting reference interval .    BUN 12 7 - 25 mg/dL   Creat 0.57 0.50 - 1.10 mg/dL   GFR, Est Non African American 125 > OR = 60 mL/min/1.26m2   GFR, Est African American 145 > OR = 60 mL/min/1.38m2   BUN/Creatinine Ratio NOT APPLICABLE 6 - 22 (calc)   Sodium 138 135 - 146 mmol/L   Potassium 3.4 (L) 3.5 - 5.3 mmol/L   Chloride 103 98 - 110 mmol/L   CO2 25 20 - 32 mmol/L   Calcium 9.0 8.6 - 10.2 mg/dL   Total Protein 7.3 6.1 - 8.1 g/dL   Albumin 3.7 3.6 - 5.1 g/dL   Globulin 3.6 1.9 - 3.7 g/dL (calc)   AG Ratio 1.0 1.0 - 2.5 (calc)   Total Bilirubin 0.3 0.2 - 1.2 mg/dL   Alkaline phosphatase (APISO) 81 31 - 125 U/L   AST 21 10 - 30 U/L   ALT 25 6 - 29 U/L  Insulin, Free (Bioactive)     Status: None   Collection Time: 12/28/18 12:00 AM  Result Value Ref Range   Insulin, Free 11.0 1.5 - 14.9 uIU/mL    Comment: . Insulin levels vary widely in specimens taken from non-fasting individuals. . Insulin analogues may demonstrate non-linear cross-reactivity in this assay. Interpret results accordingly.  PHQ2/9: Depression screen [facility] 2/9 02/26/2019 12/28/2018 06/27/2018 05/30/2018 02/15/2018  Decreased Interest 1 1 1 1 1   Down, Depressed, Hopeless 0 1 1 1  0  PHQ - 2 Score 1 2 2 2 1   Altered sleeping 1 1 0 0 0   Tired, decreased energy 1 1 1  0 1  Change in appetite 0 2 2 1 1   Feeling bad or failure about yourself  0 0 1 1 1   Trouble concentrating 0 0 0 0 0  Moving slowly or fidgety/restless 0 0 0 0 0  Suicidal thoughts 0 0 0 0 0  PHQ-9 Score 3 6 6 4 4   Difficult doing work/chores Somewhat difficult Somewhat difficult Not difficult at all Not difficult at all Somewhat difficult  Some recent data might be hidden    phq 9 is positive    Fall Risk: Fall Risk  02/26/2019 12/28/2018 06/27/2018 05/30/2018 02/15/2018  Falls in the past year? 0 0 0 0 0  Number falls in past yr: 0 0 0 0 0  Injury with Fall? 0 0 0 0 0     Functional Status Survey: Is the patient deaf or have difficulty hearing?: No Does the patient have difficulty seeing, even when wearing glasses/contacts?: No Does the patient have difficulty concentrating, remembering, or making decisions?: No Does the patient have difficulty walking or climbing stairs?: No Does the patient have difficulty dressing or bathing?: No Does the patient have difficulty doing errands alone such as visiting a doctor's office or shopping?: No    Assessment & Plan  1. Benign essential HTN  - atenolol-chlorthalidone (TENORETIC) 50-25 MG tablet; Take 1 tablet by mouth daily.  Dispense: 90 tablet; Refill: 0  2. Class 3 severe obesity with serious comorbidity and body mass index (BMI) of 60.0 to 69.9 in adult, unspecified obesity type ([facility])  - Liraglutide -Weight Management (SAXENDA) 18 MG/3ML SOPN; Inject 3 mg into the skin daily.  Dispense: 15 mL; Refill: 1  3. Depression, major, recurrent, mild ([facility])  - escitalopram (LEXAPRO) 10 MG tablet; Take 1 tablet (10 mg total) by mouth daily.  Dispense: 90 tablet; Refill: 0  4. Insulin resistance  - Liraglutide -Weight Management (SAXENDA) 18 MG/3ML SOPN; Inject 3 mg into the skin daily.  Dispense: 15 mL; Refill: 1

## 2019-03-22 ENCOUNTER — Telehealth: Payer: No Typology Code available for payment source | Admitting: Physician Assistant

## 2019-03-22 DIAGNOSIS — R399 Unspecified symptoms and signs involving the genitourinary system: Secondary | ICD-10-CM

## 2019-03-22 DIAGNOSIS — R300 Dysuria: Secondary | ICD-10-CM | POA: Diagnosis not present

## 2019-03-22 MED ORDER — NITROFURANTOIN MONOHYD MACRO 100 MG PO CAPS: 100.0000 mg | ORAL_CAPSULE | Freq: Two times a day (BID) | ORAL | 0 refills | Status: AC

## 2019-03-22 NOTE — Progress Notes (Signed)

## 2019-03-28 ENCOUNTER — Other Ambulatory Visit: Payer: Self-pay | Admitting: Family Medicine

## 2019-03-28 DIAGNOSIS — F330 Major depressive disorder, recurrent, mild: Secondary | ICD-10-CM

## 2019-03-29 MED ORDER — ESCITALOPRAM OXALATE 10 MG PO TABS: 10.0000 mg | ORAL_TABLET | Freq: Every day | ORAL | 0 refills | Status: DC

## 2019-04-03 ENCOUNTER — Encounter: Payer: Self-pay | Admitting: Family Medicine

## 2019-04-18 ENCOUNTER — Other Ambulatory Visit: Payer: Self-pay

## 2019-04-18 DIAGNOSIS — Z6841 Body Mass Index (BMI) 40.0 and over, adult: Secondary | ICD-10-CM

## 2019-04-18 DIAGNOSIS — E8881 Metabolic syndrome: Secondary | ICD-10-CM

## 2019-04-18 MED ORDER — SAXENDA 18 MG/3ML ~~LOC~~ SOPN: 3.0000 mg | PEN_INJECTOR | Freq: Every day | SUBCUTANEOUS | 0 refills | Status: DC

## 2019-05-16 ENCOUNTER — Telehealth: Payer: Self-pay

## 2019-05-16 NOTE — Telephone Encounter (Signed)
Called patient to inform her we preformed the PA for Saxenda and it was approved for 4 months from 05/15/2019 to 09/13/2019. Renewal for Saxenda requires that the patient loses at least 4% of baseline body weight after 4 months of treatment. Left detailed message stating her pharmacy [facility] was notified to fill the medication and during her last visit on 02/26/2019 she was 459 pounds, so she needs to lose 18 or more pounds on her next visit with being on Saxenda for it to be approved again.

## 2019-05-27 ENCOUNTER — Ambulatory Visit: Payer: No Typology Code available for payment source | Admitting: Family Medicine

## 2019-05-29 ENCOUNTER — Other Ambulatory Visit: Payer: Self-pay | Admitting: Family Medicine

## 2019-05-29 DIAGNOSIS — I10 Essential (primary) hypertension: Secondary | ICD-10-CM

## 2019-07-17 ENCOUNTER — Ambulatory Visit: Payer: No Typology Code available for payment source | Admitting: Family Medicine

## 2019-08-19 ENCOUNTER — Telehealth: Payer: Self-pay | Admitting: Family Medicine

## 2019-08-20 MED ORDER — BUPROPION HCL ER (XL) 300 MG PO TB24: 300.0000 mg | ORAL_TABLET | Freq: Every day | ORAL | 0 refills | Status: DC

## 2019-08-21 NOTE — Telephone Encounter (Signed)
lvm for scheduling °

## 2019-09-04 ENCOUNTER — Other Ambulatory Visit: Payer: Self-pay | Admitting: Family Medicine

## 2019-09-04 DIAGNOSIS — I10 Essential (primary) hypertension: Secondary | ICD-10-CM

## 2019-09-04 NOTE — Telephone Encounter (Signed)
Requested medication (s) are due for refill today: yes  Requested medication (s) are on the active medication list: yes  Last refill:  05/29/2019  Future visit scheduled: no  Notes to clinic:  follow up has not been scheduled    Requested Prescriptions  Pending Prescriptions Disp Refills   atenolol-chlorthalidone (TENORETIC) 50-25 MG tablet [Pharmacy Med Name: ATENOLOL-CHLORTHALIDONE 50- 50-25 Tablet] 90 tablet 0    Sig: TAKE 1 TABLET BY MOUTH DAILY.      Cardiovascular: Beta Blocker + Diuretic Combos Failed - 09/04/2019 10:02 AM      Failed - K in normal range and within 180 days    Potassium  Date Value Ref Range Status  12/28/2018 3.4 (L) 3.5 - 5.3 mmol/L Final  06/24/2013 3.9 3.5 - 5.1 mmol/L Final          Failed - Na in normal range and within 180 days    Sodium  Date Value Ref Range Status  12/28/2018 138 135 - 146 mmol/L Final  06/27/2017 138 134 - 144 mmol/L Final  06/24/2013 139 136 - 145 mmol/L Final          Failed - Cr in normal range and within 180 days    Creat  Date Value Ref Range Status  12/28/2018 0.57 0.50 - 1.10 mg/dL Final          Failed - Ca in normal range and within 180 days    Calcium  Date Value Ref Range Status  12/28/2018 9.0 8.6 - 10.2 mg/dL Final   Calcium, Total  Date Value Ref Range Status  06/24/2013 9.4 8.5 - 10.1 mg/dL Final          Failed - Valid encounter within last 6 months    Recent Outpatient Visits           6 months ago Benign essential HTN   [facility], Drue Stager, MD   8 months ago Class 3 severe obesity with serious comorbidity and body mass index (BMI) of 60.0 to 69.9 in adult, unspecified obesity type [facility])   [facility] Steele Sizer, MD   1 year ago Class 3 severe obesity with serious comorbidity and body mass index (BMI) of 60.0 to 69.9 in adult, unspecified obesity type [facility])   [facility] Steele Sizer, MD   1 year ago Depression,  major, recurrent, mild [facility])   [facility] Steele Sizer, MD   1 year ago Acute non-recurrent pansinusitis   [place], Astrid Divine, [place]              Passed - Patient is not pregnant      Passed - Last BP in normal range    BP Readings from Last 1 Encounters:  02/26/19 118/80          Passed - Last Heart Rate in normal range    Pulse Readings from Last 1 Encounters:  02/26/19 88

## 2019-09-05 ENCOUNTER — Encounter: Payer: Self-pay | Admitting: Family Medicine

## 2019-09-05 NOTE — Telephone Encounter (Signed)
Pt scheduled an appt 09/10/2019, is requesting refill of medication

## 2019-09-06 ENCOUNTER — Other Ambulatory Visit: Payer: Self-pay | Admitting: Family Medicine

## 2019-09-06 DIAGNOSIS — I10 Essential (primary) hypertension: Secondary | ICD-10-CM

## 2019-09-06 NOTE — Addendum Note (Signed)
Addended by: Chilton Greathouse on: 09/06/2019 01:23 PM   Modules accepted: Orders

## 2019-09-06 NOTE — Telephone Encounter (Signed)
Requested medication (s) are due for refill today: yes  Requested medication (s) are on the active medication list: yes  Last refill: 05/29/19  #90 0 refills  Future visit scheduled: yes patient has scheduled  Last request refused for needing OV  Notes to clinic:  Labs out of date    Requested Prescriptions  Pending Prescriptions Disp Refills   atenolol-chlorthalidone (TENORETIC) 50-25 MG tablet [Pharmacy Med Name: ATENOLOL-CHLORTHALIDONE 50- 50-25 Tablet] 90 tablet 0    Sig: TAKE 1 TABLET BY MOUTH DAILY.      Cardiovascular: Beta Blocker + Diuretic Combos Failed - 09/06/2019  2:52 PM      Failed - K in normal range and within 180 days    Potassium  Date Value Ref Range Status  12/28/2018 3.4 (L) 3.5 - 5.3 mmol/L Final  06/24/2013 3.9 3.5 - 5.1 mmol/L Final          Failed - Na in normal range and within 180 days    Sodium  Date Value Ref Range Status  12/28/2018 138 135 - 146 mmol/L Final  06/27/2017 138 134 - 144 mmol/L Final  06/24/2013 139 136 - 145 mmol/L Final          Failed - Cr in normal range and within 180 days    Creat  Date Value Ref Range Status  12/28/2018 0.57 0.50 - 1.10 mg/dL Final          Failed - Ca in normal range and within 180 days    Calcium  Date Value Ref Range Status  12/28/2018 9.0 8.6 - 10.2 mg/dL Final   Calcium, Total  Date Value Ref Range Status  06/24/2013 9.4 8.5 - 10.1 mg/dL Final          Failed - Valid encounter within last 6 months    Recent Outpatient Visits           6 months ago Benign essential HTN   [facility], Drue Stager, [place]   8 months ago Class 3 severe obesity with serious comorbidity and body mass index (BMI) of 60.0 to 69.9 in adult, unspecified obesity type [facility])   [facility] Steele Sizer, MD   1 year ago Class 3 severe obesity with serious comorbidity and body mass index (BMI) of 60.0 to 69.9 in adult, unspecified obesity type [facility])   [facility] Steele Sizer, MD   1 year ago Depression, major, recurrent, mild [facility])   [facility] Steele Sizer, MD   1 year ago Acute non-recurrent pansinusitis   [place], [place], [place]       Future Appointments             In 4 days Steele Sizer, MD [facility], [place] - Patient is not pregnant      Passed - Last BP in normal range    BP Readings from Last 1 Encounters:  02/26/19 118/80          Passed - Last Heart Rate in normal range    Pulse Readings from Last 1 Encounters:  02/26/19 88

## 2019-09-08 ENCOUNTER — Other Ambulatory Visit: Payer: Self-pay | Admitting: Family Medicine

## 2019-09-08 DIAGNOSIS — I10 Essential (primary) hypertension: Secondary | ICD-10-CM

## 2019-09-08 MED ORDER — ATENOLOL-CHLORTHALIDONE 50-25 MG PO TABS: 1.0000 | ORAL_TABLET | Freq: Every day | ORAL | 0 refills | Status: DC

## 2019-09-09 ENCOUNTER — Other Ambulatory Visit: Payer: Self-pay | Admitting: Family Medicine

## 2019-09-09 DIAGNOSIS — I10 Essential (primary) hypertension: Secondary | ICD-10-CM

## 2019-09-10 ENCOUNTER — Encounter: Payer: Self-pay | Admitting: Family Medicine

## 2019-09-10 ENCOUNTER — Other Ambulatory Visit: Payer: Self-pay | Admitting: Family Medicine

## 2019-09-10 ENCOUNTER — Other Ambulatory Visit: Payer: Self-pay

## 2019-09-10 ENCOUNTER — Ambulatory Visit (INDEPENDENT_AMBULATORY_CARE_PROVIDER_SITE_OTHER): Payer: No Typology Code available for payment source | Admitting: Family Medicine

## 2019-09-10 VITALS — BP 100/60 | HR 108 | Temp 96.8°F | Resp 16 | Ht 68.0 in | Wt >= 6400 oz

## 2019-09-10 DIAGNOSIS — Z6841 Body Mass Index (BMI) 40.0 and over, adult: Secondary | ICD-10-CM

## 2019-09-10 DIAGNOSIS — F330 Major depressive disorder, recurrent, mild: Secondary | ICD-10-CM | POA: Diagnosis not present

## 2019-09-10 DIAGNOSIS — I10 Essential (primary) hypertension: Secondary | ICD-10-CM | POA: Diagnosis not present

## 2019-09-10 DIAGNOSIS — E8881 Metabolic syndrome: Secondary | ICD-10-CM

## 2019-09-10 MED ORDER — ATENOLOL-CHLORTHALIDONE 50-25 MG PO TABS: 1.0000 | ORAL_TABLET | Freq: Every day | ORAL | 1 refills | Status: DC

## 2019-09-10 MED ORDER — ESCITALOPRAM OXALATE 10 MG PO TABS: 10.0000 mg | ORAL_TABLET | Freq: Every day | ORAL | 1 refills | Status: DC

## 2019-09-10 MED ORDER — BUPROPION HCL ER (XL) 300 MG PO TB24: 300.0000 mg | ORAL_TABLET | Freq: Every day | ORAL | 1 refills | Status: DC

## 2019-09-10 MED ORDER — SAXENDA 18 MG/3ML ~~LOC~~ SOPN: 0.6000 mg | PEN_INJECTOR | Freq: Every day | SUBCUTANEOUS | 2 refills | Status: DC

## 2019-09-10 NOTE — Progress Notes (Signed)
Name: Kimberly Cannon   MRN: [id number]    DOB: 08-30-1990   Date:09/10/2019       Progress Note  Subjective  Chief Complaint  Chief Complaint  Patient presents with  . Medication Refill    Needs refills on all of her medications.    HPI  HTN: she has been taking medications as prescribed, but ran out on Friday, picked up rx on Monday, unable to get bp today - she gained another 20 lbs since last visit.  No chest pain, dizzinessor palpitation.  Morbid Obesity: she has been obese all her life. She has tried Metformin, Trulicity, Saxenda. We gave her samples of Saxenda and help curb her appetite but insurance denied coverage. Today she is up to 480 lbs, the heaviest she has ever been. She has tried low carb diets, meal replacement shakes, Atkins diet, she has seen the weight loss center through [facility], medications and diets helps temporarily but she is unable to keep it down. Discussed long term complications of weight gain/obesity , she is willing to consider bariatric surgery   Depression: she has history of depression when she was teenager but never treated. 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 She also lost her great-grandmother recently and is grieving. She feels medication helps. Discussed adding counseling    Patient Active Problem List   Diagnosis Date Noted  . Class 3 severe obesity with serious comorbidity and body mass index (BMI) of 60.0 to 69.9 in adult ([place]) 05/30/2018  . Dermatofibroma 12/07/2017  . Left anterior knee pain 03/17/2017  . Lymphedema 08/04/2014  .  Acanthosis nigricans 08/02/2014  . Benign essential HTN 08/02/2014  . Allergic contact dermatitis 08/02/2014  . Extreme obesity 08/02/2014  . Infrequent menses 08/02/2014  . Bilateral polycystic ovarian syndrome 08/02/2014  . Allergic rhinitis, seasonal 08/02/2014  . Vitamin D deficiency 10/12/2007    History reviewed. No pertinent surgical history.  Family History  Problem Relation Age of Onset  . High blood pressure Mother   . Thyroid disease Mother   . Depression Mother   . Obesity Mother   . Obesity Father   . Sleep apnea Father   . Diabetes Paternal Uncle   . Diabetes Paternal Grandfather   . Cancer Paternal Grandmother     Social History   Tobacco Use  . Smoking status: Never Smoker  . Smokeless tobacco: Never Used  Substance Use Topics  . Alcohol use: No    Alcohol/week: 0.0 standard drinks     Current Outpatient Medications:  .  atenolol-chlorthalidone (TENORETIC) 50-25 MG tablet, Take 1 tablet by mouth daily., Disp: 30 tablet, Rfl: 0 .  buPROPion (WELLBUTRIN XL) 300 MG 24 hr tablet, Take 1 tablet (300 mg total) by mouth daily., Disp: 30 tablet, Rfl: 0 .  Cholecalciferol (VITAMIN D) 50 MCG (2000 UT) CAPS, Take 2,000 Units by mouth daily., Disp: , Rfl:  .  escitalopram (LEXAPRO) 10 MG tablet, Take 1 tablet (10 mg total) by mouth daily., Disp: 90 tablet, Rfl: 0 .  D3-50 1.25 MG (50000 UT) capsule, , Disp: , Rfl: 0 .  Liraglutide -Weight Management (SAXENDA)  18 MG/3ML SOPN, Inject 0.5 mLs (3 mg total) into the skin daily. (Patient not taking: Reported on 09/10/2019), Disp: 15 mL, Rfl: 0  Allergies  Allergen Reactions  . Penicillins     I personally reviewed active problem list, medication list, allergies, family history, social history, health maintenance with the patient/caregiver today.   ROS  Constitutional: Negative for fever, positive for weight change.  Respiratory: Negative for cough and shortness of breath.   Cardiovascular: Negative for chest pain  or palpitations.  Gastrointestinal: Negative for abdominal pain, no bowel changes.  Musculoskeletal: Negative for gait problem, but no  joint swelling.  Skin: Negative for rash.  Neurological: Negative for dizziness or headache.  No other specific complaints in a complete review of systems (except as listed in HPI above).  Objective  Vitals:   09/10/19 1509  Pulse: (!) 108  Resp: 16  Temp: (!) 96.8 F (36 C)  TempSrc: Temporal  SpO2: 98%  Weight: (!) 480 lb 8 oz (218 kg)  Height: 5\' 8"  (1.727 m)    Body mass index is 73.06 kg/m.  Physical Exam  Constitutional: Patient appears well-developed and well-nourished. Obese  No distress.  HEENT: head atraumatic, normocephalic, pupils equal and reactive to light, neck supple, throat within normal limits Cardiovascular: Normal rate, regular rhythm and normal heart sounds.  No murmur heard. No BLE edema. Pulmonary/Chest: Effort normal and breath sounds normal. No respiratory distress. Abdominal: Soft.  There is no tenderness. Psychiatric: Patient has a normal mood and affect. behavior is normal. Judgment and thought content normal.  PHQ2/9: Depression screen [facility] 2/9 09/10/2019 02/26/2019 12/28/2018 06/27/2018 05/30/2018  Decreased Interest 1 1 1 1 1   Down, Depressed, Hopeless 0 0 1 1 1   PHQ - 2 Score 1 1 2 2 2   Altered sleeping 1 1 1  0 0  Tired, decreased energy 1 1 1 1  0  Change in appetite 1 0 2 2 1   Feeling bad or failure about yourself  0 0 0 1 1  Trouble concentrating 0 0 0 0 0  Moving slowly or fidgety/restless 0 0 0 0 0  Suicidal thoughts 0 0 0 0 0  PHQ-9 Score 4 3 6 6 4   Difficult doing work/chores Somewhat difficult Somewhat difficult Somewhat difficult Not difficult at all Not difficult at all  Some recent data might be hidden    phq 9 is positive   Fall Risk: Fall Risk  09/10/2019 02/26/2019 12/28/2018 06/27/2018 05/30/2018  Falls in the past year? 0 0 0 0 0  Number falls in past yr: 0 0 0 0 0  Injury with Fall? 0 0 0  0 0     Functional Status Survey: Is the patient deaf or have difficulty hearing?: No Does the patient have difficulty seeing, even when wearing glasses/contacts?: No Does the patient have difficulty concentrating, remembering, or making decisions?: No Does the patient have difficulty walking or climbing stairs?: No Does the patient have difficulty dressing or bathing?: No Does the patient have difficulty doing errands alone such as visiting a doctor's office or shopping?: No    Assessment & Plan  1. Morbid obesity with BMI of 70 and over, adult [facility])  We will try PA for Saxenda again  - Amb Referral to Bariatric Surgery - Liraglutide -Weight Management (SAXENDA) 18 MG/3ML SOPN; Inject 0.1-0.5 mLs (0.6-3 mg total) into the skin daily.  Dispense: 9 mL; Refill: 2  2. Benign essential HTN  - atenolol-chlorthalidone (TENORETIC) 50-25 MG tablet; Take 1 tablet  by mouth daily.  Dispense: 90 tablet; Refill: 1  3. Metabolic syndrome  Resume GLP1 agonist   4. Depression, major, recurrent, mild (HCC)  - buPROPion (WELLBUTRIN XL) 300 MG 24 hr tablet; Take 1 tablet (300 mg total) by mouth daily.  Dispense: 90 tablet; Refill: 1 - escitalopram (LEXAPRO) 10 MG tablet; Take 1 tablet (10 mg total) by mouth daily.  Dispense: 90 tablet; Refill: 1

## 2020-01-13 ENCOUNTER — Other Ambulatory Visit: Payer: Self-pay | Admitting: Physician Assistant

## 2020-01-13 ENCOUNTER — Telehealth: Payer: No Typology Code available for payment source | Admitting: Physician Assistant

## 2020-01-13 DIAGNOSIS — H60501 Unspecified acute noninfective otitis externa, right ear: Secondary | ICD-10-CM | POA: Diagnosis not present

## 2020-01-13 MED ORDER — CIPROFLOXACIN-DEXAMETHASONE 0.3-0.1 % OT SUSP
4.0000 [drp] | Freq: Two times a day (BID) | OTIC | 0 refills | Status: DC
Start: 2020-01-13 — End: 2020-01-13

## 2020-01-13 MED ORDER — SULFAMETHOXAZOLE-TRIMETHOPRIM 800-160 MG PO TABS: 1.0000 | ORAL_TABLET | Freq: Two times a day (BID) | ORAL | 0 refills | Status: DC

## 2020-01-13 MED FILL — CIPROFLOXACIN-DEXAMETHASONE: 0.3-0.1 | 19 days supply | Qty: 8 | Fill #0

## 2020-01-13 MED FILL — SULFAMETHOXAZOLE-TMP DS TAB: 800-160 | 10 days supply | Qty: 20 | Fill #0

## 2020-01-13 NOTE — Progress Notes (Signed)
E Visit for Swimmer's Ear  We are sorry that you are not feeling well. Here is how we plan to help!  I have prescribed: Ciprofloxin 0.2% and hydrocortisone 1% otic suspension 3 drops in affected ears twice daily for 7 days  Bactrim 800mg /160mg  one tablet by mouth twice a day for 10 days  In certain cases swimmer's ear may progress to a more serious bacterial infection of the middle or inner ear.  If you have a fever 102 and up and significantly worsening symptoms, this could indicate a more serious infection moving to the middle/inner and needs face to face evaluation in an office by a provider.  Your symptoms should improve over the next 3 days and should resolve in about 7 days.  HOME CARE:   Wash your hands frequently.  Do not place the tip of the bottle on your ear or touch it with your fingers.  You can take Acetominophen 650 mg every 4-6 hours as needed for pain.  If pain is severe or moderate, you can apply a heating pad (set on low) or hot water bottle (wrapped in a towel) to outer ear for 20 minutes.  This will also increase drainage.  Avoid ear plugs  Do not use Q-tips  After showers, help the water run out by tilting your head to one side.  GET HELP RIGHT AWAY IF:   Fever is over 102.2 degrees.  You develop progressive ear pain or hearing loss.  Ear symptoms persist longer than 3 days after treatment.  MAKE SURE YOU:   Understand these instructions.  Will watch your condition.  Will get help right away if you are not doing well or get worse.  TO PREVENT SWIMMER'S EAR:  Use a bathing cap or custom fitted swim molds to keep your ears dry.  Towel off after swimming to dry your ears.  Tilt your head or pull your earlobes to allow the water to escape your ear canal.  If there is still water in your ears, consider using a hairdryer on the lowest setting.  Thank you for choosing an e-visit. Your e-visit answers were reviewed by a board certified advanced  clinical practitioner to complete your personal care plan. Depending upon the condition, your plan could have included both over the counter or prescription medications. Please review your pharmacy choice. Be sure that the pharmacy you have chosen is open so that you can pick up your prescription now.  If there is a problem you may message your provider in [place] to have the prescription routed to another pharmacy. Your safety is important to [place]. If you have drug allergies check your prescription carefully.  For the next 24 hours, you can use MyChart to ask questions about today's visit, request a non-urgent call back, or ask for a work or school excuse from your e-visit provider. You will get an email in the next two days asking about your experience. I hope that your e-visit has been valuable and will speed your recovery.     Greater than 5 minutes, yet less than 10 minutes of time have been spent researching, coordinating, and implementing care for this patient today

## 2020-01-15 ENCOUNTER — Encounter: Payer: No Typology Code available for payment source | Admitting: Family Medicine

## 2020-06-10 ENCOUNTER — Other Ambulatory Visit: Payer: Self-pay | Admitting: Family Medicine

## 2020-06-10 DIAGNOSIS — F330 Major depressive disorder, recurrent, mild: Secondary | ICD-10-CM

## 2020-06-10 NOTE — Telephone Encounter (Signed)
Requested medications are due for refill today.  yes  Requested medications are on the active medications list.  yes  Last refill. 09/10/2019  Future visit scheduled.   no  Notes to clinic.  Pt was to return 12/2019 for follow up.

## 2020-06-11 ENCOUNTER — Other Ambulatory Visit: Payer: Self-pay

## 2020-06-11 ENCOUNTER — Other Ambulatory Visit: Payer: Self-pay | Admitting: Family Medicine

## 2020-06-11 DIAGNOSIS — F330 Major depressive disorder, recurrent, mild: Secondary | ICD-10-CM

## 2020-06-11 NOTE — Telephone Encounter (Signed)
lvm for pt to call the office and schedule an appt 

## 2020-06-11 NOTE — Telephone Encounter (Signed)
Pt called

## 2020-06-12 ENCOUNTER — Other Ambulatory Visit: Payer: Self-pay

## 2020-06-12 ENCOUNTER — Encounter: Payer: Self-pay | Admitting: Family Medicine

## 2020-06-12 ENCOUNTER — Ambulatory Visit (INDEPENDENT_AMBULATORY_CARE_PROVIDER_SITE_OTHER): Payer: No Typology Code available for payment source | Admitting: Family Medicine

## 2020-06-12 ENCOUNTER — Other Ambulatory Visit: Payer: Self-pay | Admitting: Family Medicine

## 2020-06-12 VITALS — BP 136/88 | HR 89 | Temp 98.0°F | Resp 16 | Ht 67.0 in | Wt >= 6400 oz

## 2020-06-12 DIAGNOSIS — F330 Major depressive disorder, recurrent, mild: Secondary | ICD-10-CM

## 2020-06-12 DIAGNOSIS — E559 Vitamin D deficiency, unspecified: Secondary | ICD-10-CM

## 2020-06-12 DIAGNOSIS — Z114 Encounter for screening for human immunodeficiency virus [HIV]: Secondary | ICD-10-CM

## 2020-06-12 DIAGNOSIS — E8881 Metabolic syndrome: Secondary | ICD-10-CM | POA: Diagnosis not present

## 2020-06-12 DIAGNOSIS — Z1159 Encounter for screening for other viral diseases: Secondary | ICD-10-CM

## 2020-06-12 DIAGNOSIS — Z6841 Body Mass Index (BMI) 40.0 and over, adult: Secondary | ICD-10-CM

## 2020-06-12 DIAGNOSIS — Z13220 Encounter for screening for lipoid disorders: Secondary | ICD-10-CM

## 2020-06-12 DIAGNOSIS — I10 Essential (primary) hypertension: Secondary | ICD-10-CM | POA: Diagnosis not present

## 2020-06-12 MED ORDER — ESCITALOPRAM OXALATE 10 MG PO TABS
ORAL_TABLET | Freq: Every day | ORAL | 1 refills | Status: DC
Start: 2020-06-12 — End: 2021-02-14
  Filled 2020-06-12: qty 90, 90d supply, fill #0
  Filled 2020-10-15: qty 90, 90d supply, fill #1

## 2020-06-12 MED ORDER — NALTREXONE-BUPROPION HCL ER 8-90 MG PO TB12: 2.0000 | ORAL_TABLET | Freq: Two times a day (BID) | ORAL | 2 refills | Status: DC

## 2020-06-12 MED ORDER — ATENOLOL-CHLORTHALIDONE 50-25 MG PO TABS
1.0000 | ORAL_TABLET | Freq: Every day | ORAL | 1 refills | Status: DC
  Filled 2020-06-12: qty 90, 90d supply, fill #0
  Filled 2020-10-15: qty 90, 90d supply, fill #1

## 2020-06-12 NOTE — Progress Notes (Signed)
Name: Kimberly Cannon   MRN: [id number]    DOB: 09-14-1990   Date:06/12/2020       Progress Note  Subjective  Chief Complaint  Medication Refill  HPI  HTN: she has been taking medications as prescribed,she gained another 21 lbs since last visit.  No chest pain, dizzinessor palpitation.  Morbid Obesity: she has been obese all her life. She has tried Metformin, Trulicity, Saxenda. We gave her samples of Saxenda and help curb her appetite but insurance denied coverage. Today she is up to 501 lbs, the heaviest she has ever been ( up 21 lbs since last visit in 08/2019 ) . She has tried low carb diets, meal replacement shakes, Atkins diet, she has seen the weight loss center through [facility], medications and diets helps temporarily but she is unable to keep it down. Discussed long term complications of weight gain/obesity , we will try Contrave today, she states she attended the bariatric seminars and is willing to have the surgery done.  Depression: she has history of depression when she was teenager but never treated. Her step father went to prison early Summer242mother got depressed and had other medical problems and she felt very sad, increase in appetite and weight gain, lack of motivation and it affected her sleep ( difficulty falling asleep).Lexapro sinceNov 2018andwe added Wellbutrin Feb 2019 . She sates she is feeling better since she decided to have bariatric surgery , since we are starting her on contrave we will stop wellbutrin for now   Patient Active Problem List   Diagnosis Date Noted  . Class 3 severe obesity with serious comorbidity and body mass index (BMI) of 60.0 to 69.9 in adult ([place]) 05/30/2018  . Dermatofibroma 12/07/2017  . Left anterior knee pain 03/17/2017  . Lymphedema 08/04/2014  . Acanthosis nigricans 08/02/2014  . Benign essential HTN 08/02/2014  . Allergic contact dermatitis 08/02/2014  . Extreme obesity 08/02/2014  . Infrequent menses 08/02/2014  .  Bilateral polycystic ovarian syndrome 08/02/2014  . Allergic rhinitis, seasonal 08/02/2014  . Vitamin D deficiency 10/12/2007    No past surgical history on file.  Family History  Problem Relation Age of Onset  . High blood pressure Mother   . Thyroid disease Mother   . Depression Mother   . Obesity Mother   . Obesity Father   . Sleep apnea Father   . Diabetes Paternal Uncle   . Diabetes Paternal Grandfather   . Cancer Paternal Grandmother     Social History   Tobacco Use  . Smoking status: Never Smoker  . Smokeless tobacco: Never Used  Substance Use Topics  . Alcohol use: No    Alcohol/week: 0.0 standard drinks     Current Outpatient Medications:  .  atenolol-chlorthalidone (TENORETIC) 50-25 MG tablet, TAKE 1 TABLET BY MOUTH DAILY., Disp: 90 tablet, Rfl: 1 .  buPROPion (WELLBUTRIN XL) 300 MG 24 hr tablet, TAKE 1 TABLET BY MOUTH DAILY., Disp: 90 tablet, Rfl: 1 .  Cholecalciferol (VITAMIN D) 50 MCG (2000 UT) CAPS, Take 2,000 Units by mouth daily., Disp: , Rfl:  .  escitalopram (LEXAPRO) 10 MG tablet, TAKE 1 TABLET BY MOUTH DAILY., Disp: 90 tablet, Rfl: 1 .  ciprofloxacin-dexamethasone (CIPRODEX) OTIC suspension, PLACE 4 DROPS INTO THE RIGHT EAR 2 (TWO) TIMES DAILY FOR 7 DAYS. DISCARD REMAINING (Patient not taking: Reported on 06/12/2020), Disp: 7.5 mL, Rfl: 0 .  Liraglutide -Weight Management (SAXENDA) 18 MG/3ML SOPN, Inject 0.1-0.5 mLs (0.6-3 mg total) into the skin daily. (Patient not taking:  Reported on 06/12/2020), Disp: 9 mL, Rfl: 2 .  sulfamethoxazole-trimethoprim (BACTRIM DS) 800-160 MG tablet, TAKE 1 TABLET BY MOUTH TWICE DAILY FOR 10 DAYS. (Patient not taking: Reported on 06/12/2020), Disp: 20 tablet, Rfl: 0  Allergies  Allergen Reactions  . Penicillins     I personally reviewed active problem list, medication list, allergies, family history, social history, health maintenance with the patient/caregiver today.   ROS  Constitutional: Negative for fever or weight  change.  Respiratory: Negative for cough and shortness of breath.   Cardiovascular: Negative for chest pain or palpitations.  Gastrointestinal: Negative for abdominal pain, no bowel changes.  Musculoskeletal: Negative for gait problem or joint swelling.  Skin: Negative for rash.  Neurological: Negative for dizziness or headache.  No other specific complaints in a complete review of systems (except as listed in HPI above).  Objective  Vitals:   06/12/20 1055  BP: 136/88  Pulse: 89  Resp: 16  Temp: 98 F (36.7 C)  TempSrc: Oral  SpO2: 97%  Weight: (!) 501 lb (227.3 kg)  Height: 5\' 7"  (1.702 m)    Body mass index is 78.47 kg/m.  Physical Exam  Constitutional: Patient appears well-developed and well-nourished. Obese  No distress.  HEENT: head atraumatic, normocephalic, pupils equal and reactive to light,  neck supple Cardiovascular: Normal rate, regular rhythm and normal heart sounds.  No murmur heard. No BLE edema. Pulmonary/Chest: Effort normal and breath sounds normal. No respiratory distress. Abdominal: Soft.  There is no tenderness. Psychiatric: Patient has a normal mood and affect. behavior is normal. Judgment and thought content normal.  PHQ2/9: Depression screen [facility] 2/9 06/12/2020 09/10/2019 02/26/2019 12/28/2018 06/27/2018  Decreased Interest 1 1 1 1 1   Down, Depressed, Hopeless 0 0 0 1 1  PHQ - 2 Score 1 1 1 2 2   Altered sleeping 0 1 1 1  0  Tired, decreased energy 0 1 1 1 1   Change in appetite 0 1 0 2 2  Feeling bad or failure about yourself  0 0 0 0 1  Trouble concentrating 0 0 0 0 0  Moving slowly or fidgety/restless 0 0 0 0 0  Suicidal thoughts 0 0 0 0 0  PHQ-9 Score 1 4 3 6 6   Difficult doing work/chores - Somewhat difficult Somewhat difficult Somewhat difficult Not difficult at all  Some recent data might be hidden    phq 9 is positive   Fall Risk: Fall Risk  06/12/2020 09/10/2019 02/26/2019 12/28/2018 06/27/2018  Falls in the past year? 0 0 0 0 0  Number  falls in past yr: 0 0 0 0 0  Injury with Fall? 0 0 0 0 0     Functional Status Survey: Is the patient deaf or have difficulty hearing?: No Does the patient have difficulty seeing, even when wearing glasses/contacts?: No Does the patient have difficulty concentrating, remembering, or making decisions?: No Does the patient have difficulty walking or climbing stairs?: Yes Does the patient have difficulty dressing or bathing?: No Does the patient have difficulty doing errands alone such as visiting a doctor's office or shopping?: No   Assessment & Plan  1. Morbid obesity with BMI of 70 and over, adult ([place])  - TSH  2. Benign essential HTN  - CBC with Differential/Platelet - COMPLETE METABOLIC PANEL WITH GFR - atenolol-chlorthalidone (TENORETIC) 50-25 MG tablet; Take 1 tablet by mouth daily.  Dispense: 90 tablet; Refill: 1  3. Metabolic syndrome  - Hemoglobin A1c  4. Depression, major, recurrent, mild ([place])  -  escitalopram (LEXAPRO) 10 MG tablet; TAKE 1 TABLET BY MOUTH DAILY.  Dispense: 90 tablet; Refill: 1  5. Vitamin D deficiency  - VITAMIN D 25 Hydroxy (Vit-D Deficiency, Fractures)  6. Lipid screening  - Lipid panel  7. Need for hepatitis C screening test  - Hepatitis C antibody  8. Encounter for screening for HIV  - HIV Antibody (routine testing w rflx)

## 2020-06-12 NOTE — Patient Instructions (Signed)
Start 1 tablet every morning for 7 days, then 1 tablet twice daily for 7 days, then 2 tablets every morning and one in the evening

## 2020-06-15 LAB — COMPLETE METABOLIC PANEL WITH GFR
AG Ratio: 1.1 (calc) (ref 1.0–2.5)
ALT: 18 U/L (ref 6–29)
AST: 18 U/L (ref 10–30)
Albumin: 3.8 g/dL (ref 3.6–5.1)
Alkaline phosphatase (APISO): 89 U/L (ref 31–125)
BUN: 12 mg/dL (ref 7–25)
CO2: 28 mmol/L (ref 20–32)
Calcium: 9.1 mg/dL (ref 8.6–10.2)
Chloride: 103 mmol/L (ref 98–110)
Creat: 0.52 mg/dL (ref 0.50–1.10)
GFR, Est African American: 149 mL/min/{1.73_m2} (ref >=60)
GFR, Est Non African American: 128 mL/min/{1.73_m2} (ref >=60)
Globulin: 3.5 g/dL (calc) (ref 1.9–3.7)
Glucose, Bld: 84 mg/dL (ref 65–99)
Potassium: 3.7 mmol/L (ref 3.5–5.3)
Sodium: 139 mmol/L (ref 135–146)
Total Bilirubin: 0.2 mg/dL (ref 0.2–1.2)
Total Protein: 7.3 g/dL (ref 6.1–8.1)

## 2020-06-15 LAB — CBC WITH DIFFERENTIAL/PLATELET
Absolute Monocytes: 378 cells/uL (ref 200–950)
Basophils Absolute: 32 cells/uL (ref 0–200)
Basophils Relative: 0.5 %
Eosinophils Absolute: 211 cells/uL (ref 15–500)
Eosinophils Relative: 3.3 %
HCT: 38.4 % (ref 35.0–45.0)
Hemoglobin: 12.3 g/dL (ref 11.7–15.5)
Lymphs Abs: 3098 cells/uL (ref 850–3900)
MCH: 27.2 pg (ref 27.0–33.0)
MCHC: 32 g/dL (ref 32.0–36.0)
MCV: 84.8 fL (ref 80.0–100.0)
MPV: 10.2 fL (ref 7.5–12.5)
Monocytes Relative: 5.9 %
Neutro Abs: 2682 cells/uL (ref 1500–7800)
Neutrophils Relative %: 41.9 %
Platelets: 347 10*3/uL (ref 140–400)
RBC: 4.53 10*6/uL (ref 3.80–5.10)
RDW: 13.8 % (ref 11.0–15.0)
Total Lymphocyte: 48.4 %
WBC: 6.4 10*3/uL (ref 3.8–10.8)

## 2020-06-15 LAB — LIPID PANEL
Cholesterol: 172 mg/dL (ref <200)
HDL: 52 mg/dL (ref >=50)
LDL Cholesterol (Calc): 106 mg/dL (calc) — ABNORMAL HIGH
Non-HDL Cholesterol (Calc): 120 mg/dL (calc) (ref <130)
Total CHOL/HDL Ratio: 3.3 (calc) (ref <5.0)
Triglycerides: 58 mg/dL (ref <150)

## 2020-06-15 LAB — HEMOGLOBIN A1C
Hgb A1c MFr Bld: 5.6 % of total Hgb (ref <5.7)
Mean Plasma Glucose: 114 mg/dL
eAG (mmol/L): 6.3 mmol/L

## 2020-06-15 LAB — TSH: TSH: 2.21 mIU/L

## 2020-06-15 LAB — VITAMIN D 25 HYDROXY (VIT D DEFICIENCY, FRACTURES): Vit D, 25-Hydroxy: 20 ng/mL — ABNORMAL LOW (ref 30–100)

## 2020-06-15 LAB — HEPATITIS C ANTIBODY
Hepatitis C Ab: NONREACTIVE
SIGNAL TO CUT-OFF: 0.14 (ref <1.00)

## 2020-06-15 LAB — HIV ANTIBODY (ROUTINE TESTING W REFLEX): HIV 1&2 Ab, 4th Generation: NONREACTIVE

## 2020-07-08 ENCOUNTER — Encounter: Payer: Self-pay | Admitting: Family Medicine

## 2020-07-14 MED ORDER — NALTREXONE-BUPROPION HCL ER 8-90 MG PO TB12: 2.0000 | ORAL_TABLET | Freq: Two times a day (BID) | ORAL | 2 refills | Status: DC

## 2020-07-16 ENCOUNTER — Encounter: Payer: Self-pay | Admitting: Family Medicine

## 2020-07-16 ENCOUNTER — Ambulatory Visit (INDEPENDENT_AMBULATORY_CARE_PROVIDER_SITE_OTHER): Payer: No Typology Code available for payment source | Admitting: Family Medicine

## 2020-07-16 ENCOUNTER — Other Ambulatory Visit: Payer: Self-pay

## 2020-07-16 VITALS — BP 128/84 | HR 93 | Temp 98.2°F | Resp 16 | Ht 67.0 in | Wt >= 6400 oz

## 2020-07-16 DIAGNOSIS — Z0000 Encounter for general adult medical examination without abnormal findings: Secondary | ICD-10-CM

## 2020-07-16 NOTE — Patient Instructions (Signed)
Preventive Care 21-31 Years Old, Female Preventive care refers to lifestyle choices and visits with your health care provider that can promote health and wellness. This includes:  A yearly physical exam. This is also called an annual wellness visit.  Regular dental and eye exams.  Immunizations.  Screening for certain conditions.  Healthy lifestyle choices, such as: ? Eating a healthy diet. ? Getting regular exercise. ? Not using drugs or products that contain nicotine and tobacco. ? Limiting alcohol use. What can I expect for my preventive care visit? Physical exam Your health care provider may check your:  Height and weight. These may be used to calculate your BMI (body mass index). BMI is a measurement that tells if you are at a healthy weight.  Heart rate and blood pressure.  Body temperature.  Skin for abnormal spots. Counseling Your health care provider may ask you questions about your:  Past medical problems.  Family's medical history.  Alcohol, tobacco, and drug use.  Emotional well-being.  Home life and relationship well-being.  Sexual activity.  Diet, exercise, and sleep habits.  Work and work environment.  Access to firearms.  Method of birth control.  Menstrual cycle.  Pregnancy history. What immunizations do I need? Vaccines are usually given at various ages, according to a schedule. Your health care provider will recommend vaccines for you based on your age, medical history, and lifestyle or other factors, such as travel or where you work.   What tests do I need? Blood tests  Lipid and cholesterol levels. These may be checked every 5 years starting at age 20.  Hepatitis C test.  Hepatitis B test. Screening  Diabetes screening. This is done by checking your blood sugar (glucose) after you have not eaten for a while (fasting).  STD (sexually transmitted disease) testing, if you are at risk.  BRCA-related cancer screening. This may be  done if you have a family history of breast, ovarian, tubal, or peritoneal cancers.  Pelvic exam and Pap test. This may be done every 3 years starting at age 21. Starting at age 30, this may be done every 5 years if you have a Pap test in combination with an HPV test. Talk with your health care provider about your test results, treatment options, and if necessary, the need for more tests.   Follow these instructions at home: Eating and drinking  Eat a healthy diet that includes fresh fruits and vegetables, whole grains, lean protein, and low-fat dairy products.  Take vitamin and mineral supplements as recommended by your health care provider.  Do not drink alcohol if: ? Your health care provider tells you not to drink. ? You are pregnant, may be pregnant, or are planning to become pregnant.  If you drink alcohol: ? Limit how much you have to 0-1 drink a day. ? Be aware of how much alcohol is in your drink. In the [place] one drink equals one 12 oz bottle of beer (355 mL), one 5 oz glass of wine (148 mL), or one 1 oz glass of hard liquor (44 mL).   Lifestyle  Take daily care of your teeth and gums. Brush your teeth every morning and night with fluoride toothpaste. Floss one time each day.  Stay active. Exercise for at least 30 minutes 5 or more days each week.  Do not use any products that contain nicotine or tobacco, such as cigarettes, e-cigarettes, and chewing tobacco. If you need help quitting, ask your health care provider.  Do not   use drugs.  If you are sexually active, practice safe sex. Use a condom or other form of protection to prevent STIs (sexually transmitted infections).  If you do not wish to become pregnant, use a form of birth control. If you plan to become pregnant, see your health care provider for a prepregnancy visit.  Find healthy ways to cope with stress, such as: ? Meditation, yoga, or listening to music. ? Journaling. ? Talking to a trusted  person. ? Spending time with friends and family. Safety  Always wear your seat belt while driving or riding in a vehicle.  Do not drive: ? If you have been drinking alcohol. Do not ride with someone who has been drinking. ? When you are tired or distracted. ? While texting.  Wear a helmet and other protective equipment during sports activities.  If you have firearms in your house, make sure you follow all gun safety procedures.  Seek help if you have been physically or sexually abused. What's next?  Go to your health care provider once a year for an annual wellness visit.  Ask your health care provider how often you should have your eyes and teeth checked.  Stay up to date on all vaccines. This information is not intended to replace advice given to you by your health care provider. Make sure you discuss any questions you have with your health care provider. Document Revised: 09/29/2019 Document Reviewed: 10/12/2017 Elsevier Patient Education  2021 [employer]  

## 2020-07-16 NOTE — Progress Notes (Signed)
Name: Kimberly Cannon   MRN: [id number]    DOB: 04-Mar-1990   Date:07/16/2020       Progress Note  Subjective  Chief Complaint  Annual Exam  HPI  Patient presents for annual CPE.  Diet: she is eating breakfast daily now, and trying to skip meals so she eats better in the evening  Exercise: need to increase to 150 minutes per week   [place] Office Visit from 02/26/2019 in [facility]  AUDIT-C Score 0     Depression: Phq 9 is  positive Depression screen [facility] 2/9 07/16/2020 06/12/2020 09/10/2019 02/26/2019 12/28/2018  Decreased Interest '1 1 1 1 1  ' Down, Depressed, Hopeless 0 0 0 0 1  PHQ - 2 Score '1 1 1 1 2  ' Altered sleeping 0 0 '1 1 1  ' Tired, decreased energy 1 0 '1 1 1  ' Change in appetite 0 0 1 0 2  Feeling bad or failure about yourself  0 0 0 0 0  Trouble concentrating 0 0 0 0 0  Moving slowly or fidgety/restless 0 0 0 0 0  Suicidal thoughts 0 0 0 0 0  PHQ-9 Score '2 1 4 3 6  ' Difficult doing work/chores - - Somewhat difficult Somewhat difficult Somewhat difficult  Some recent data might be hidden   Hypertension: BP Readings from Last 3 Encounters:  07/16/20 128/84  06/12/20 136/88  09/10/19 (!) 100/60   Obesity: Wt Readings from Last 3 Encounters:  07/16/20 (!) 499 lb (226.3 kg)  06/12/20 (!) 501 lb (227.3 kg)  09/10/19 (!) 480 lb 8 oz (218 kg)   BMI Readings from Last 3 Encounters:  07/16/20 78.15 kg/m  06/12/20 78.47 kg/m  09/10/19 73.06 kg/m     Vaccines:  HPV: up to date  Pneumonia: educated and discussed with patient. Flu: educated and discussed with patient.  Hep C Screening: 06/12/20 STD testing and prevention (HIV/chl/gon/syphilis): 429/22 Intimate partner violence: Negative Sexual History :  Never  Menstrual History/LMP/Abnormal Bleeding: irregular cycles, oligomenorrhea, advised to download an app to monitor frequency  Incontinence Symptoms: no problems   Breast cancer:  - BRCA gene screening: N/A  Osteoporosis: Discussed  high calcium and vitamin D supplementation, weight bearing exercises  Cervical cancer screening: 06/01/16  Skin cancer: Discussed monitoring for atypical lesions  Colorectal cancer: N/A   ECG: 06/27/17  Advanced Care Planning: A voluntary discussion about advance care planning including the explanation and discussion of advance directives.  Discussed health care proxy and Living will, and the patient was able to identify a health care proxy as mother   Lipids: Lab Results  Component Value Date   CHOL 172 06/12/2020   CHOL 177 12/28/2018   CHOL 169 06/27/2017   Lab Results  Component Value Date   HDL 52 06/12/2020   HDL 52 12/28/2018   HDL 60 06/27/2017   Lab Results  Component Value Date   LDLCALC 106 (H) 06/12/2020   LDLCALC 112 (H) 12/28/2018   LDLCALC 97 06/27/2017   Lab Results  Component Value Date   TRIG 58 06/12/2020   TRIG 52 12/28/2018   TRIG 61 06/27/2017   Lab Results  Component Value Date   CHOLHDL 3.3 06/12/2020   CHOLHDL 3.4 12/28/2018   CHOLHDL 2.9 06/09/2016   No results found for: LDLDIRECT  Glucose: Glucose  Date Value Ref Range Status  06/27/2017 78 65 - 99 mg/dL Final  06/24/2013 82 65 - 99 mg/dL Final   Glucose, Bld  Date Value  Ref Range Status  06/12/2020 84 65 - 99 mg/dL Final    Comment:    .            Fasting reference interval .   12/28/2018 79 65 - 99 mg/dL Final    Comment:    .            Fasting reference interval .   06/09/2016 77 65 - 99 mg/dL Final    Patient Active Problem List   Diagnosis Date Noted  . Class 3 severe obesity with serious comorbidity and body mass index (BMI) of 60.0 to 69.9 in adult ([place]) 05/30/2018  . Dermatofibroma 12/07/2017  . Left anterior knee pain 03/17/2017  . Lymphedema 08/04/2014  . Acanthosis nigricans 08/02/2014  . Benign essential HTN 08/02/2014  . Allergic contact dermatitis 08/02/2014  . Extreme obesity 08/02/2014  . Infrequent menses 08/02/2014  . Bilateral polycystic  ovarian syndrome 08/02/2014  . Allergic rhinitis, seasonal 08/02/2014  . Vitamin D deficiency 10/12/2007    No past surgical history on file.  Family History  Problem Relation Age of Onset  . High blood pressure Mother   . Thyroid disease Mother   . Depression Mother   . Obesity Mother   . Obesity Father   . Sleep apnea Father   . Diabetes Paternal Uncle   . Diabetes Paternal Grandfather   . Cancer Paternal Grandmother     Social History   Socioeconomic History  . Marital status: Single    Spouse name: Not on file  . Number of children: 0  . Years of education: Not on file  . Highest education level: Associate degree: occupational, Hotel manager, or vocational program  Occupational History  . Occupation: Actuary: [employer]  Tobacco Use  . Smoking status: Never Smoker  . Smokeless tobacco: Never Used  Vaping Use  . Vaping Use: Never used  Substance and Sexual Activity  . Alcohol use: No    Alcohol/week: 0.0 standard drinks  . Drug use: No  . Sexual activity: Never  Other Topics Concern  . Not on file  Social History Narrative   Lives with her mother and two younger sisters   Social Determinants of Health   Financial Resource Strain: Low Risk   . Difficulty of Paying Living Expenses: Not hard at all  Food Insecurity: No Food Insecurity  . Worried About Charity fundraiser in the Last Year: Never true  . Ran Out of Food in the Last Year: Never true  Transportation Needs: No Transportation Needs  . Lack of Transportation (Medical): No  . Lack of Transportation (Non-Medical): No  Physical Activity: Insufficiently Active  . Days of Exercise per Week: 1 day  . Minutes of Exercise per Session: 30 min  Stress: Stress Concern Present  . Feeling of Stress : To some extent  Social Connections: Moderately Isolated  . Frequency of Communication with Friends and Family: More than three times a week  . Frequency of Social  Gatherings with Friends and Family: Twice a week  . Attends Religious Services: More than 4 times per year  . Active Member of Clubs or Organizations: No  . Attends Archivist Meetings: Never  . Marital Status: Never married  Intimate Partner Violence: Not At Risk  . Fear of Current or Ex-Partner: No  . Emotionally Abused: No  . Physically Abused: No  . Sexually Abused: No     Current Outpatient Medications:  .  atenolol-chlorthalidone (TENORETIC) 50-25 MG tablet, Take 1 tablet by mouth daily., Disp: 90 tablet, Rfl: 1 .  Cholecalciferol (VITAMIN D) 50 MCG (2000 UT) CAPS, Take 2,000 Units by mouth daily., Disp: , Rfl:  .  escitalopram (LEXAPRO) 10 MG tablet, TAKE 1 TABLET BY MOUTH DAILY., Disp: 90 tablet, Rfl: 1 .  Naltrexone-buPROPion HCl ER 8-90 MG TB12, Take 2 tablets by mouth in the morning and at bedtime., Disp: 120 tablet, Rfl: 2  Allergies  Allergen Reactions  . Penicillins      ROS  Constitutional: Negative for fever or weight change.  Respiratory: Negative for cough and shortness of breath.   Cardiovascular: Negative for chest pain or palpitations.  Gastrointestinal: Negative for abdominal pain, no bowel changes.  Musculoskeletal: Negative for gait problem or joint swelling.  Skin: Negative for rash.  Neurological: Negative for dizziness or headache.  No other specific complaints in a complete review of systems (except as listed in HPI above).   Objective  Vitals:   07/16/20 1109  BP: 128/84  Pulse: 93  Resp: 16  Temp: 98.2 F (36.8 C)  TempSrc: Oral  SpO2: 95%  Weight: (!) 499 lb (226.3 kg)  Height: '5\' 7"'  (1.702 m)    Body mass index is 78.15 kg/m.  Physical Exam  Constitutional: Patient appears well-developed and well-nourished. No distress.  HENT: Head: Normocephalic and atraumatic. Ears: B TMs ok, no erythema or effusion; Nose: Not done  Mouth/Throat: not done  Eyes: Conjunctivae and EOM are normal. Pupils are equal, round, and reactive  to light. No scleral icterus.  Neck: Normal range of motion. Neck supple. No JVD present. No thyromegaly present.  Cardiovascular: Normal rate, regular rhythm and normal heart sounds.  No murmur heard. No BLE edema. Pulmonary/Chest: Effort normal and breath sounds normal. No respiratory distress. Abdominal: Soft. Bowel sounds are normal, no distension. There is no tenderness. no masses Breast: no lumps or masses, no nipple discharge or rashes FEMALE GENITALIA:  Not done  RECTAL: not done  Musculoskeletal: Normal range of motion, no joint effusions. No gross deformities Neurological: he is alert and oriented to person, place, and time. No cranial nerve deficit. Coordination, balance, strength, speech and gait are normal.  Skin: Skin is warm and dry. No rash noted. No erythema.  Psychiatric: Patient has a normal mood and affect. behavior is normal. Judgment and thought content normal.  Recent Results (from the past 2160 hour(s))  Lipid panel     Status: Abnormal   Collection Time: 06/12/20 11:30 AM  Result Value Ref Range   Cholesterol 172 <200 mg/dL   HDL 52 > OR = 50 mg/dL   Triglycerides 58 <150 mg/dL   LDL Cholesterol (Calc) 106 (H) mg/dL (calc)    Comment: Reference range: <100 . Desirable range <100 mg/dL for primary prevention;   <70 mg/dL for patients with CHD or diabetic patients  with > or = 2 CHD risk factors. [name] LDL-C is now calculated using the Martin-Hopkins  calculation, which is a validated novel method providing  better accuracy than the Friedewald equation in the  estimation of LDL-C.  Cresenciano Genre et al. Annamaria Helling. 8786;767(20): 2061-2068  (http://education.[facility].com/faq/FAQ164)    Total CHOL/HDL Ratio 3.3 <5.0 (calc)   Non-HDL Cholesterol (Calc) 120 <130 mg/dL (calc)    Comment: For patients with diabetes plus 1 major ASCVD risk  factor, treating to a non-HDL-C goal of <100 mg/dL  (LDL-C of <70 mg/dL) is considered a therapeutic  option.   CBC with  Differential/Platelet  Status: None   Collection Time: 06/12/20 11:30 AM  Result Value Ref Range   WBC 6.4 3.8 - 10.8 Thousand/uL   RBC 4.53 3.80 - 5.10 Million/uL   Hemoglobin 12.3 11.7 - 15.5 g/dL   HCT 38.4 35.0 - 45.0 %   MCV 84.8 80.0 - 100.0 fL   MCH 27.2 27.0 - 33.0 pg   MCHC 32.0 32.0 - 36.0 g/dL   RDW 13.8 11.0 - 15.0 %   Platelets 347 140 - 400 Thousand/uL   MPV 10.2 7.5 - 12.5 fL   Neutro Abs 2,682 1,500 - 7,800 cells/uL   Lymphs Abs 3,098 850 - 3,900 cells/uL   Absolute Monocytes 378 200 - 950 cells/uL   Eosinophils Absolute 211 15 - 500 cells/uL   Basophils Absolute 32 0 - 200 cells/uL   Neutrophils Relative % 41.9 %   Total Lymphocyte 48.4 %   Monocytes Relative 5.9 %   Eosinophils Relative 3.3 %   Basophils Relative 0.5 %  COMPLETE METABOLIC PANEL WITH GFR     Status: None   Collection Time: 06/12/20 11:30 AM  Result Value Ref Range   Glucose, Bld 84 65 - 99 mg/dL    Comment: .            Fasting reference interval .    BUN 12 7 - 25 mg/dL   Creat 0.52 0.50 - 1.10 mg/dL   GFR, Est Non African American 128 > OR = 60 mL/min/1.21m   GFR, Est African American 149 > OR = 60 mL/min/1.757m  BUN/Creatinine Ratio NOT APPLICABLE 6 - 22 (calc)   Sodium 139 135 - 146 mmol/L   Potassium 3.7 3.5 - 5.3 mmol/L   Chloride 103 98 - 110 mmol/L   CO2 28 20 - 32 mmol/L   Calcium 9.1 8.6 - 10.2 mg/dL   Total Protein 7.3 6.1 - 8.1 g/dL   Albumin 3.8 3.6 - 5.1 g/dL   Globulin 3.5 1.9 - 3.7 g/dL (calc)   AG Ratio 1.1 1.0 - 2.5 (calc)   Total Bilirubin 0.2 0.2 - 1.2 mg/dL   Alkaline phosphatase (APISO) 89 31 - 125 U/L   AST 18 10 - 30 U/L   ALT 18 6 - 29 U/L  VITAMIN D 25 Hydroxy (Vit-D Deficiency, Fractures)     Status: Abnormal   Collection Time: 06/12/20 11:30 AM  Result Value Ref Range   Vit D, 25-Hydroxy 20 (L) 30 - 100 ng/mL    Comment: Vitamin D Status         25-OH Vitamin D: . Deficiency:                    <20 ng/mL Insufficiency:             20 - 29  ng/mL Optimal:                 > or = 30 ng/mL . For 25-OH Vitamin D testing on patients on  D2-supplementation and patients for whom quantitation  of D2 and D3 fractions is required, the [facility]ureD(TM) 25-OH VIT D, (D2,D3), LC/MS/MS is recommended: order  code [id number] >2y75yr See Note 1 . Note 1 . For additional information, please refer to  http://education.[facility].com/faq/FAQ199  (This link is being provided for informational/ educational purposes only.)   Hemoglobin A1c     Status: None   Collection Time: 06/12/20 11:30 AM  Result Value Ref Range   Hgb A1c MFr Bld 5.6 <5.7 % of  total Hgb    Comment: For the purpose of screening for the presence of diabetes: . <5.7%       Consistent with the absence of diabetes 5.7-6.4%    Consistent with increased risk for diabetes             (prediabetes) > or =6.5%  Consistent with diabetes . This assay result is consistent with a decreased risk of diabetes. . Currently, no consensus exists regarding use of hemoglobin A1c for diagnosis of diabetes in children. . According to American Diabetes Association (ADA) guidelines, hemoglobin A1c <7.0% represents optimal control in non-pregnant diabetic patients. Different metrics may apply to specific patient populations.  Standards of Medical Care in Diabetes(ADA). .    Mean Plasma Glucose 114 mg/dL   eAG (mmol/L) 6.3 mmol/L  TSH     Status: None   Collection Time: 06/12/20 11:30 AM  Result Value Ref Range   TSH 2.21 mIU/L    Comment:           Reference Range .           > or = 20 Years  0.40-4.50 .                Pregnancy Ranges           First trimester    0.26-2.66           Second trimester   0.55-2.73           Third trimester    0.43-2.91   Hepatitis C antibody     Status: None   Collection Time: 06/12/20 11:30 AM  Result Value Ref Range   Hepatitis C Ab NON-REACTIVE NON-REACTIVE   SIGNAL TO CUT-OFF 0.14 <1.00    Comment: . HCV antibody was  non-reactive. There is no laboratory  evidence of HCV infection. . In most cases, no further action is required. However, if recent HCV exposure is suspected, a test for HCV RNA (test code [id number]) is suggested. . For additional information please refer to http://education.[facility].com/faq/FAQ22v1 (This link is being provided for informational/ educational purposes only.) .   HIV Antibody (routine testing w rflx)     Status: None   Collection Time: 06/12/20 11:30 AM  Result Value Ref Range   HIV 1&2 Ab, 4th Generation NON-REACTIVE NON-REACTIVE    Comment: HIV-1 antigen and HIV-1/HIV-2 antibodies were not detected. There is no laboratory evidence of HIV infection. [name] PLEASE NOTE: This information has been disclosed to you from records whose confidentiality may be protected by state law.  If your state requires such protection, then the state law prohibits you from making any further disclosure of the information without the specific written consent of the person to whom it pertains, or as otherwise permitted by law. A general authorization for the release of medical or other information is NOT sufficient for this purpose. . For additional information please refer to http://education.[facility].com/faq/FAQ106 (This link is being provided for informational/ educational purposes only.) . [name] The performance of this assay has not been clinically validated in patients less than 39 years old. .       Fall Risk: Fall Risk  07/16/2020 06/12/2020 09/10/2019 02/26/2019 12/28/2018  Falls in the past year? 0 0 0 0 0  Number falls in past yr: 0 0 0 0 0  Injury with Fall? 0 0 0 0 0     Functional Status Survey: Is the patient deaf or have difficulty hearing?: No Does the patient have difficulty seeing, even  when wearing glasses/contacts?: No Does the patient have difficulty concentrating, remembering, or making decisions?: No Does the patient have difficulty walking or  climbing stairs?: Yes Does the patient have difficulty dressing or bathing?: No Does the patient have difficulty doing errands alone such as visiting a doctor's office or shopping?: No   Assessment & Plan  1. Well adult exam  She has not received contrave yet  -USPSTF grade A and B recommendations reviewed with patient; age-appropriate recommendations, preventive care, screening tests, etc discussed and encouraged; healthy living encouraged; see AVS for patient education given to patient -Discussed importance of 150 minutes of physical activity weekly, eat two servings of fish weekly, eat one serving of tree nuts ( cashews, pistachios, pecans, almonds.[name]) every other day, eat 6 servings of fruit/vegetables daily and drink plenty of water and avoid sweet beverages.

## 2020-10-16 ENCOUNTER — Other Ambulatory Visit: Payer: Self-pay

## 2020-12-14 ENCOUNTER — Ambulatory Visit: Payer: No Typology Code available for payment source | Admitting: Family Medicine

## 2021-01-12 ENCOUNTER — Encounter: Payer: Self-pay | Admitting: Family Medicine

## 2021-01-13 ENCOUNTER — Other Ambulatory Visit: Payer: Self-pay | Admitting: Family Medicine

## 2021-01-13 DIAGNOSIS — R239 Unspecified skin changes: Secondary | ICD-10-CM

## 2021-02-14 ENCOUNTER — Other Ambulatory Visit: Payer: Self-pay | Admitting: Family Medicine

## 2021-02-14 DIAGNOSIS — F330 Major depressive disorder, recurrent, mild: Secondary | ICD-10-CM

## 2021-02-14 DIAGNOSIS — I10 Essential (primary) hypertension: Secondary | ICD-10-CM

## 2021-02-15 ENCOUNTER — Other Ambulatory Visit: Payer: Self-pay

## 2021-02-16 ENCOUNTER — Other Ambulatory Visit: Payer: Self-pay

## 2021-02-16 MED ORDER — ATENOLOL-CHLORTHALIDONE 50-25 MG PO TABS
1.0000 | ORAL_TABLET | Freq: Every day | ORAL | 1 refills | Status: DC
  Filled 2021-02-16: qty 90, 90d supply, fill #0
  Filled 2021-05-18: qty 90, 90d supply, fill #1

## 2021-02-16 MED ORDER — ESCITALOPRAM OXALATE 10 MG PO TABS
ORAL_TABLET | Freq: Every day | ORAL | 1 refills | Status: DC
  Filled 2021-02-16: qty 90, 90d supply, fill #0
  Filled 2021-05-18: qty 90, 90d supply, fill #1

## 2021-02-17 ENCOUNTER — Other Ambulatory Visit: Payer: Self-pay

## 2021-03-25 ENCOUNTER — Encounter: Payer: Self-pay | Admitting: Family Medicine

## 2021-03-31 ENCOUNTER — Other Ambulatory Visit (HOSPITAL_COMMUNITY): Payer: Self-pay

## 2021-03-31 ENCOUNTER — Other Ambulatory Visit: Payer: Self-pay

## 2021-03-31 ENCOUNTER — Other Ambulatory Visit: Payer: Self-pay | Admitting: Family

## 2021-03-31 ENCOUNTER — Telehealth: Payer: No Typology Code available for payment source | Admitting: Physician Assistant

## 2021-03-31 DIAGNOSIS — H60332 Swimmer's ear, left ear: Secondary | ICD-10-CM

## 2021-03-31 MED ORDER — DOXYCYCLINE HYCLATE 100 MG PO TABS
100.0000 mg | ORAL_TABLET | Freq: Two times a day (BID) | ORAL | 0 refills | Status: DC
  Filled 2021-03-31: qty 14, 7d supply, fill #0

## 2021-03-31 MED ORDER — CLINDAMYCIN HCL 300 MG PO CAPS
300.0000 mg | ORAL_CAPSULE | Freq: Two times a day (BID) | ORAL | 0 refills | Status: DC
  Filled 2021-03-31: qty 20, 10d supply, fill #0

## 2021-03-31 MED ORDER — CEFDINIR 300 MG PO CAPS
300.0000 mg | ORAL_CAPSULE | Freq: Two times a day (BID) | ORAL | 0 refills | Status: DC
  Filled 2021-03-31 (×2): qty 20, 10d supply, fill #0

## 2021-03-31 MED ORDER — NEOMYCIN-POLYMYXIN-HC 3.5-10000-1 OT SOLN
4.0000 [drp] | Freq: Four times a day (QID) | OTIC | 0 refills | Status: DC
  Filled 2021-03-31: qty 10, 13d supply, fill #0

## 2021-03-31 NOTE — Progress Notes (Signed)
I have spent 5 minutes in review of e-visit questionnaire, review and updating patient chart, medical decision making and response to patient.   Courtnie Brenes Cody Shelena Castelluccio, PA-C    

## 2021-03-31 NOTE — Progress Notes (Signed)
E Visit for Swimmer's Ear  We are sorry that you are not feeling well. Here is how we plan to help!  Based on what you have shared with me it looks like you have swimmers ear. Swimmer's ear is a redness or swelling, irritation, or infection of your outer ear canal.  These symptoms usually occur within a few days of swimming.  Your ear canal is a tube that goes from the opening of the ear to the eardrum.  When water stays in your ear canal, germs can grow.  This is a painful condition that often happens to children and swimmers of all ages.  It is not contagious and oral antibiotics are not required to treat uncomplicated swimmer's ear.  The usual symptoms include: Itching inside the ear, Redness or a sense of swelling in the ear, Pain when the ear is tugged on when pressure is placed on the ear, Pus draining from the infected ear. and I have prescribed: Neomycin 0.35%, polymyxin B 10,000 units/mL, and hydrocortisone 0,5% otic solution 4 drops in affected ears four times a day for 7 days  In certain cases swimmer's ear may progress to a more serious bacterial infection of the middle or inner ear.  If you have a fever 102 and up and significantly worsening symptoms, this could indicate a more serious infection moving to the middle/inner and needs face to face evaluation in an office by a provider.  Your symptoms should improve over the next 3 days and should resolve in about 7 days.  HOME CARE:  Wash your hands frequently. Do not place the tip of the bottle on your ear or touch it with your fingers. You can take Acetominophen 650 mg every 4-6 hours as needed for pain.  If pain is severe or moderate, you can apply a heating pad (set on low) or hot water bottle (wrapped in a towel) to outer ear for 20 minutes.  This will also increase drainage. Avoid ear plugs Do not use Q-tips After showers, help the water run out by tilting your head to one side.  GET HELP RIGHT AWAY IF:  Fever is over 102.2  degrees. You develop progressive ear pain or hearing loss. Ear symptoms persist longer than 3 days after treatment.  MAKE SURE YOU:  Understand these instructions. Will watch your condition. Will get help right away if you are not doing well or get worse.  TO PREVENT SWIMMER'S EAR: Use a bathing cap or custom fitted swim molds to keep your ears dry. Towel off after swimming to dry your ears. Tilt your head or pull your earlobes to allow the water to escape your ear canal. If there is still water in your ears, consider using a hairdryer on the lowest setting.   Thank you for choosing an e-visit.  Your e-visit answers were reviewed by a board certified advanced clinical practitioner to complete your personal care plan. Depending upon the condition, your plan could have included both over the counter or prescription medications.  Please review your pharmacy choice. Make sure the pharmacy is open so you can pick up prescription now. If there is a problem, you may contact your provider through MyChart messaging and have the prescription routed to another pharmacy.  Your safety is important to us. If you have drug allergies check your prescription carefully.   For the next 24 hours you can use MyChart to ask questions about today's visit, request a non-urgent call back, or ask for a work or   school excuse. You will get an email in the next two days asking about your experience. I hope that your e-visit has been valuable and will speed your recovery.    

## 2021-03-31 NOTE — Addendum Note (Signed)
Addended by: Brunetta Jeans on: 03/31/2021 03:53 PM   Modules accepted: Orders

## 2021-03-31 NOTE — Addendum Note (Signed)
Addended by: Evelina Dun A on: 03/31/2021 12:33 PM   Modules accepted: Orders

## 2021-04-02 ENCOUNTER — Encounter: Payer: Self-pay | Admitting: Family Medicine

## 2021-04-02 ENCOUNTER — Other Ambulatory Visit: Payer: Self-pay

## 2021-04-05 ENCOUNTER — Encounter: Payer: Self-pay | Admitting: Family Medicine

## 2021-04-05 ENCOUNTER — Other Ambulatory Visit: Payer: Self-pay

## 2021-04-05 ENCOUNTER — Ambulatory Visit (INDEPENDENT_AMBULATORY_CARE_PROVIDER_SITE_OTHER): Payer: No Typology Code available for payment source | Admitting: Family Medicine

## 2021-04-05 VITALS — BP 128/84 | HR 93 | Resp 16 | Ht 68.0 in | Wt >= 6400 oz

## 2021-04-05 DIAGNOSIS — E8881 Metabolic syndrome: Secondary | ICD-10-CM

## 2021-04-05 DIAGNOSIS — F330 Major depressive disorder, recurrent, mild: Secondary | ICD-10-CM | POA: Diagnosis not present

## 2021-04-05 DIAGNOSIS — E559 Vitamin D deficiency, unspecified: Secondary | ICD-10-CM

## 2021-04-05 DIAGNOSIS — I10 Essential (primary) hypertension: Secondary | ICD-10-CM

## 2021-04-05 DIAGNOSIS — Z809 Family history of malignant neoplasm, unspecified: Secondary | ICD-10-CM

## 2021-04-05 DIAGNOSIS — Z6841 Body Mass Index (BMI) 40.0 and over, adult: Secondary | ICD-10-CM

## 2021-04-05 MED ORDER — NALTREXONE-BUPROPION HCL ER 8-90 MG PO TB12: 2.0000 | ORAL_TABLET | Freq: Two times a day (BID) | ORAL | 2 refills | Status: DC

## 2021-04-05 NOTE — Progress Notes (Signed)
Name: Kimberly Cannon   MRN: [id number]    DOB: 1991-01-17   Date:04/05/2021       Progress Note  Subjective  Chief Complaint  Medication Refill  HPI  HTN: she has been taking medications as prescribed, weight is stable since last year, no chest pain or palpitation. BP is at goal   Family history of colon cancer: father diagnosed at age 32 , his mother diagnosed with breast cancer but not sure of the age, discussed genetic testing.    Morbid Obesity: she has been obese all her life. She has tried Metformin, Trulicity, Saxenda. We gave her samples of Saxenda and help curb her appetite but insurance denied coverage. April of 2021 she was 501 lbs, the heaviest she has ever been . She has tried low carb diets, meal replacement shakes, Atkins diet, she has seen the weight loss center through [facility], medications and diets helps temporarily but she is unable to keep it down. We started her on Contrave in 2022, we also discussed bariatric surgery but she never went to the meetings. She states father diagnosed with colon cancer and she got busy . She just started the rx of Contrave January 2022 and just resumed Saxenda last week. She has noticed it curbing her appetite. She is not sure of original starting weight but she knows it was higher than 501 lbs since her lab coat did not fit anymore    Depression: she has history of depression when she was teenager but never treated. Her step father went to prison early Summer 2018  mother got depressed and had other medical problems and she felt very sad, increase in appetite and weight gain, lack of motivation and it affected her sleep ( difficulty falling asleep).   Lexapro since Nov 2018 and we added Wellbutrin Feb 2019, but she is off since on Contrave. Phq 9 is 2 today   Patient Active Problem List   Diagnosis Date Noted   Class 3 severe obesity with serious comorbidity and body mass index (BMI) of 60.0 to 69.9 in adult [facility]) 05/30/2018   Dermatofibroma  12/07/2017   Left anterior knee pain 03/17/2017   Lymphedema 08/04/2014   Acanthosis nigricans 08/02/2014   Benign essential HTN 08/02/2014   Allergic contact dermatitis 08/02/2014   Extreme obesity 08/02/2014   Infrequent menses 08/02/2014   Bilateral polycystic ovarian syndrome 08/02/2014   Allergic rhinitis, seasonal 08/02/2014   Vitamin D deficiency 10/12/2007    No past surgical history on file.  Family History  Problem Relation Age of Onset   High blood pressure Mother    Thyroid disease Mother    Depression Mother    Obesity Mother    Obesity Father    Sleep apnea Father    Diabetes Paternal Uncle    Diabetes Paternal Grandfather    Cancer Paternal Grandmother     Social History   Tobacco Use   Smoking status: Never   Smokeless tobacco: Never  Substance Use Topics   Alcohol use: No    Alcohol/week: 0.0 standard drinks     Current Outpatient Medications:    atenolol-chlorthalidone (TENORETIC) 50-25 MG tablet, Take 1 tablet by mouth daily., Disp: 90 tablet, Rfl: 1   clindamycin (CLEOCIN) 300 MG capsule, Take 1 capsule (300 mg total) by mouth in the morning and at bedtime for 10 days., Disp: 20 capsule, Rfl: 0   doxycycline (VIBRA-TABS) 100 MG tablet, Take 1 tablet (100 mg total) by mouth 2 (two) times daily., Disp:  14 tablet, Rfl: 0   escitalopram (LEXAPRO) 10 MG tablet, TAKE 1 TABLET BY MOUTH DAILY., Disp: 90 tablet, Rfl: 1   Liraglutide -Weight Management (SAXENDA) 18 MG/3ML SOPN, Inject into the skin daily., Disp: , Rfl:    Naltrexone-buPROPion HCl ER 8-90 MG TB12, Take 2 tablets by mouth in the morning and at bedtime., Disp: 120 tablet, Rfl: 2   VITAMIN D PO, Take 1,000 Units by mouth daily., Disp: , Rfl:    neomycin-polymyxin-hydrocortisone (CORTISPORIN) OTIC solution, Place 4 drops into the left ear 4 (four) times daily. (Patient not taking: Reported on 04/05/2021), Disp: 10 mL, Rfl: 0  Allergies  Allergen Reactions   Penicillins     I personally  reviewed active problem list, medication list, allergies, family history with the patient/caregiver today.   ROS  Constitutional: Negative for fever or weight change.  Respiratory: Negative for cough and shortness of breath.   Cardiovascular: Negative for chest pain or palpitations.  Gastrointestinal: Negative for abdominal pain, no bowel changes.  Musculoskeletal: Negative for gait problem or joint swelling.  Skin: Negative for rash.  Neurological: Negative for dizziness or headache.  No other specific complaints in a complete review of systems (except as listed in HPI above).   Objective  Vitals:   04/05/21 1445  BP: 128/84  Pulse: 93  Resp: 16  SpO2: 99%  Weight: (!) 498 lb (225.9 kg)  Height: 5\' 8"  (1.727 m)    Body mass index is 75.72 kg/m.  Physical Exam  Constitutional: Patient appears well-developed and well-nourished. Obese  No distress.  HEENT: head atraumatic, normocephalic, pupils equal and reactive to light, neck supple Cardiovascular: Normal rate, regular rhythm and normal heart sounds.  No murmur heard. No BLE edema. Pulmonary/Chest: Effort normal and breath sounds normal. No respiratory distress. Abdominal: Soft.  There is no tenderness. Psychiatric: Patient has a normal mood and affect. behavior is normal. Judgment and thought content normal.   PHQ2/9: Depression screen [facility] 2/9 04/05/2021 07/16/2020 06/12/2020 09/10/2019 02/26/2019  Decreased Interest 1 1 1 1 1   Down, Depressed, Hopeless 1 0 0 0 0  PHQ - 2 Score 2 1 1 1 1   Altered sleeping 0 0 0 1 1  Tired, decreased energy 0 1 0 1 1  Change in appetite 0 0 0 1 0  Feeling bad or failure about yourself  0 0 0 0 0  Trouble concentrating 0 0 0 0 0  Moving slowly or fidgety/restless 0 0 0 0 0  Suicidal thoughts 0 0 0 0 0  PHQ-9 Score 2 2 1 4 3   Difficult doing work/chores - - - Somewhat difficult Somewhat difficult  Some recent data might be hidden    phq 9 is positive   Fall Risk: Fall Risk  04/05/2021  07/16/2020 06/12/2020 09/10/2019 02/26/2019  Falls in the past year? 1 0 0 0 0  Number falls in past yr: 0 0 0 0 0  Injury with Fall? 0 0 0 0 0  Risk for fall due to : No Fall Risks - - - -  Follow up Falls prevention discussed - - - -      Functional Status Survey: Is the patient deaf or have difficulty hearing?: No Does the patient have difficulty seeing, even when wearing glasses/contacts?: No Does the patient have difficulty concentrating, remembering, or making decisions?: No Does the patient have difficulty walking or climbing stairs?: Yes Does the patient have difficulty dressing or bathing?: No Does the patient have difficulty doing errands  alone such as visiting a doctor's office or shopping?: No    Assessment & Plan  1. Benign essential HTN   2. Metabolic syndrome   3. Morbid obesity with BMI of 70 and over, adult ([place])  - Naltrexone-buPROPion HCl ER 8-90 MG TB12; Take 2 tablets by mouth in the morning and at bedtime.  Dispense: 120 tablet; Refill: 2  4. Depression, major, recurrent, mild ([place])   5. Vitamin D deficiency   6. Family history of cancer  - Ambulatory referral to [facility]

## 2021-04-20 ENCOUNTER — Inpatient Hospital Stay: Payer: No Typology Code available for payment source

## 2021-04-20 ENCOUNTER — Inpatient Hospital Stay: Payer: No Typology Code available for payment source | Admitting: Licensed Clinical Social Worker

## 2021-04-20 ENCOUNTER — Telehealth: Payer: Self-pay | Admitting: *Deleted

## 2021-04-20 NOTE — Telephone Encounter (Signed)
Patient did not give a reason for the cancelation. ?

## 2021-04-20 NOTE — Telephone Encounter (Signed)
Patient called to cancel genetic counseling appointment. ?

## 2021-05-18 ENCOUNTER — Other Ambulatory Visit: Payer: Self-pay

## 2021-05-24 ENCOUNTER — Other Ambulatory Visit: Payer: Self-pay | Admitting: Family Medicine

## 2021-05-25 ENCOUNTER — Other Ambulatory Visit: Payer: Self-pay

## 2021-05-25 MED ORDER — SAXENDA 18 MG/3ML ~~LOC~~ SOPN
3.0000 mg | PEN_INJECTOR | Freq: Every day | SUBCUTANEOUS | 3 refills | Status: DC
  Filled 2021-05-25 – 2021-05-27 (×2): qty 15, 30d supply, fill #0
  Filled 2021-07-01: qty 15, 30d supply, fill #1
  Filled 2021-07-27: qty 15, 30d supply, fill #2
  Filled 2021-08-26: qty 15, 30d supply, fill #3

## 2021-05-27 ENCOUNTER — Other Ambulatory Visit: Payer: Self-pay

## 2021-05-27 MED ORDER — UNIFINE PENTIPS 31G X 6 MM MISC
0 refills | Status: DC
  Filled 2021-05-27: qty 100, 90d supply, fill #0

## 2021-06-02 ENCOUNTER — Other Ambulatory Visit: Payer: Self-pay

## 2021-06-28 ENCOUNTER — Telehealth: Payer: No Typology Code available for payment source | Admitting: Physician Assistant

## 2021-06-28 ENCOUNTER — Other Ambulatory Visit (HOSPITAL_COMMUNITY): Payer: Self-pay

## 2021-06-28 DIAGNOSIS — H60332 Swimmer's ear, left ear: Secondary | ICD-10-CM

## 2021-06-28 MED ORDER — NEOMYCIN-POLYMYXIN-HC 3.5-10000-1 OT SOLN
4.0000 [drp] | Freq: Four times a day (QID) | OTIC | 0 refills | Status: AC
  Filled 2021-06-28: qty 10, 7d supply, fill #0

## 2021-06-28 MED ORDER — SULFAMETHOXAZOLE-TRIMETHOPRIM 800-160 MG PO TABS
1.0000 | ORAL_TABLET | Freq: Two times a day (BID) | ORAL | 0 refills | Status: DC
  Filled 2021-06-28: qty 14, 7d supply, fill #0

## 2021-06-28 NOTE — Progress Notes (Signed)
E Visit for Swimmer's Ear ? ?We are sorry that you are not feeling well. Here is how we plan to help! ? ?I have prescribed: Neomycin 0.35%, polymyxin B 10,000 units/mL, and hydrocortisone 0,5% otic solution 4 drops in affected ears four times a day for 7 days ? ? ?In certain cases swimmer's ear may progress to a more serious bacterial infection of the middle or inner ear.  If you have a fever 102 and up and significantly worsening symptoms, this could indicate a more serious infection moving to the middle/inner and needs face to face evaluation in an office by a provider. ? ?Your symptoms should improve over the next 3 days and should resolve in about 7 days. ? ?HOME CARE: ? ?Wash your hands frequently. ?Do not place the tip of the bottle on your ear or touch it with your fingers. ?You can take Acetominophen 650 mg every 4-6 hours as needed for pain.  If pain is severe or moderate, you can apply a heating pad (set on low) or hot water bottle (wrapped in a towel) to outer ear for 20 minutes.  This will also increase drainage. ?Avoid ear plugs ?Do not use Q-tips ?After showers, help the water run out by tilting your head to one side. ? ?GET HELP RIGHT AWAY IF: ? ?Fever is over 102.2 degrees. ?You develop progressive ear pain or hearing loss. ?Ear symptoms persist longer than 3 days after treatment. ? ?MAKE SURE YOU: ? ?Understand these instructions. ?Will watch your condition. ?Will get help right away if you are not doing well or get worse. ? ?TO PREVENT SWIMMER'S EAR: ?Use a bathing cap or custom fitted swim molds to keep your ears dry. ?Towel off after swimming to dry your ears. ?Tilt your head or pull your earlobes to allow the water to escape your ear canal. ?If there is still water in your ears, consider using a hairdryer on the lowest setting. ? ? ?Thank you for choosing an e-visit. ? ?Your e-visit answers were reviewed by a board certified advanced clinical practitioner to complete your personal care plan.  Depending upon the condition, your plan could have included both over the counter or prescription medications. ? ?Please review your pharmacy choice. Make sure the pharmacy is open so you can pick up prescription now. If there is a problem, you may contact your provider through [employer] and have the prescription routed to another pharmacy.  Your safety is important to [place]. If you have drug allergies check your prescription carefully.  ? ?For the next 24 hours you can use MyChart to ask questions about today's visit, request a non-urgent call back, or ask for a work or school excuse. ?You will get an email in the next two days asking about your experience. I hope that your e-visit has been valuable and will speed your recovery. ? ? ? ?

## 2021-06-28 NOTE — Progress Notes (Signed)
I have spent 5 minutes in review of e-visit questionnaire, review and updating patient chart, medical decision making and response to patient.   Tu Shimmel Cody Zaniah [place], PA-C    

## 2021-06-28 NOTE — Addendum Note (Signed)
Addended by: Brunetta Jeans on: 06/28/2021 11:39 AM ? ? Modules accepted: Orders ? ?

## 2021-07-02 ENCOUNTER — Ambulatory Visit: Payer: No Typology Code available for payment source | Admitting: Family Medicine

## 2021-07-02 ENCOUNTER — Other Ambulatory Visit: Payer: Self-pay

## 2021-07-20 ENCOUNTER — Encounter: Payer: No Typology Code available for payment source | Admitting: Family Medicine

## 2021-07-27 ENCOUNTER — Other Ambulatory Visit: Payer: Self-pay

## 2021-08-03 ENCOUNTER — Other Ambulatory Visit: Payer: Self-pay | Admitting: Internal Medicine

## 2021-08-03 DIAGNOSIS — F330 Major depressive disorder, recurrent, mild: Secondary | ICD-10-CM

## 2021-08-03 DIAGNOSIS — I10 Essential (primary) hypertension: Secondary | ICD-10-CM

## 2021-08-04 ENCOUNTER — Other Ambulatory Visit: Payer: Self-pay | Admitting: Family Medicine

## 2021-08-04 ENCOUNTER — Other Ambulatory Visit: Payer: Self-pay

## 2021-08-04 ENCOUNTER — Encounter: Payer: Self-pay | Admitting: Family Medicine

## 2021-08-04 MED ORDER — ATENOLOL-CHLORTHALIDONE 50-25 MG PO TABS
1.0000 | ORAL_TABLET | Freq: Every day | ORAL | 1 refills | Status: DC
  Filled 2021-08-04: qty 90, 90d supply, fill #0
  Filled 2021-11-09 – 2021-11-10 (×2): qty 90, 90d supply, fill #1

## 2021-08-04 MED ORDER — ESCITALOPRAM OXALATE 10 MG PO TABS
ORAL_TABLET | Freq: Every day | ORAL | 1 refills | Status: DC
  Filled 2021-08-04: qty 90, 90d supply, fill #0
  Filled 2021-11-09 – 2021-11-10 (×2): qty 90, 90d supply, fill #1

## 2021-08-04 NOTE — Telephone Encounter (Signed)
Requested Prescriptions  Pending Prescriptions Disp Refills  . atenolol-chlorthalidone (TENORETIC) 50-25 MG tablet 90 tablet 1    Sig: Take 1 tablet by mouth daily.     Cardiovascular: Beta Blocker + Diuretic Combos Failed - 08/03/2021  9:45 PM      Failed - K in normal range and within 180 days    Potassium  Date Value Ref Range Status  06/12/2020 3.7 3.5 - 5.3 mmol/L Final  06/24/2013 3.9 3.5 - 5.1 mmol/L Final         Failed - Na in normal range and within 180 days    Sodium  Date Value Ref Range Status  06/12/2020 139 135 - 146 mmol/L Final  06/27/2017 138 134 - 144 mmol/L Final  06/24/2013 139 136 - 145 mmol/L Final         Failed - Cr in normal range and within 180 days    Creat  Date Value Ref Range Status  06/12/2020 0.52 0.50 - 1.10 mg/dL Final         Failed - eGFR in normal range and within 180 days    GFR, Est African American  Date Value Ref Range Status  06/12/2020 149 > OR = 60 mL/min/1.58m Final   GFR, Est Non African American  Date Value Ref Range Status  06/12/2020 128 > OR = 60 mL/min/1.761mFinal         Passed - Last BP in normal range    BP Readings from Last 1 Encounters:  04/05/21 128/84         Passed - Last Heart Rate in normal range    Pulse Readings from Last 1 Encounters:  04/05/21 93         Passed - Valid encounter within last 6 months    Recent Outpatient Visits          4 months ago [facility] essential [facility]   1 year ago Well adult [facility]   [facility]   1 year ago Morbid [facility] with BMI of 70 and over, [facility]   1 year ago [facility] with [facility] of 70 and over, [facility]   2 years ago Benign essential HTN   [facility]             . escitalopram (LEXAPRO) 10 MG tablet 90 tablet  1    Sig: TAKE 1 TABLET BY MOUTH DAILY.     Psychiatry:  Antidepressants - SSRI Passed - 08/03/2021  9:45 PM      Passed - Valid encounter within last 6 months    Recent Outpatient Visits          4 months ago Benign essential HTN   [facility]   1 year ago Well adult exam   [facility]   1 year ago Morbid obesity with BMI of 70 and over, adult ([facility]  [facility]   1 year ago [facility]bid [facility] with [facility] of 70 and over, [facility]  [facility]   2 years ago Benign essential HTN   [facility]

## 2021-08-04 NOTE — Telephone Encounter (Signed)
Requested medication (s) are due for refill today: yes  Requested medication (s) are on the active medication list: yes  Last refill:  02/16/21 #90/1  Future visit scheduled: no  Notes to clinic:  Unable to refill per protocol due to failed labs, no updated results.    Requested Prescriptions  Pending Prescriptions Disp Refills   atenolol-chlorthalidone (TENORETIC) 50-25 MG tablet 90 tablet 1    Sig: Take 1 tablet by mouth daily.     Cardiovascular: Beta Blocker + Diuretic Combos Failed - 08/03/2021  9:45 PM      Failed - K in normal range and within 180 days    Potassium  Date Value Ref Range Status  06/12/2020 3.7 3.5 - 5.3 mmol/L Final  06/24/2013 3.9 3.5 - 5.1 mmol/L Final         Failed - Na in normal range and within 180 days    Sodium  Date Value Ref Range Status  06/12/2020 139 135 - 146 mmol/L Final  06/27/2017 138 134 - 144 mmol/L Final  06/24/2013 139 136 - 145 mmol/L Final         Failed - Cr in normal range and within 180 days    Creat  Date Value Ref Range Status  06/12/2020 0.52 0.50 - 1.10 mg/dL Final         Failed - eGFR in normal range and within 180 days    GFR, Est African American  Date Value Ref Range Status  06/12/2020 149 > OR = 60 mL/min/1.46m Final   GFR, Est Non African American  Date Value Ref Range Status  06/12/2020 128 > OR = 60 mL/min/1.758mFinal         Passed - Last BP in normal range    BP Readings from Last 1 Encounters:  04/05/21 128/84         Passed - Last Heart Rate in normal range    Pulse Readings from Last 1 Encounters:  04/05/21 93         Passed - Valid encounter within last 6 months    Recent Outpatient Visits           4 months ago Benign essential HTN   [facility]   1 year ago Well adult exam   [facility]   1 year ago [facility]bid [facility] with BMI of 70 and over, [facility]  [facility]    1 year ago Morbid [facility] with BMI of 70 and over, [facility] ([facility]  [facility]   2 years ago Benign essential HTN   [facility]              Signed Prescriptions Disp Refills   escitalopram (LEXAPRO) 10 MG tablet 90 tablet 1    Sig: TAKE 1 TABLET BY MOUTH DAILY.     Psychiatry:  Antidepressants - SSRI Passed - 08/03/2021  9:45 PM      Passed - Valid encounter within last 6 months    Recent Outpatient Visits           4 months ago Benign essential HTN   [facility]   1 year ago Well adult exam   [facility]   1 year ago [facility]bid [facility] of 70 and [facility]  [facility] [place], Drue Stager, MD   1 year ago Morbid obesity with BMI of 70 and over, adult [facility])   [facility] Steele Sizer, MD   2 years ago Benign essential HTN   [facility] Steele Sizer, MD

## 2021-08-05 NOTE — Progress Notes (Unsigned)
Name: Kimberly Cannon   MRN: [id number]    DOB: 1991-02-14   Date:08/05/2021       Progress Note  Subjective  Chief Complaint  Medication Refill  HPI  HTN: she has been taking medications as prescribed, weight is stable since last year, no chest pain or palpitation. BP is at goal   Family history of colon cancer: father diagnosed at age 32 , his mother diagnosed with breast cancer but not sure of the age, discussed genetic testing.    Morbid Obesity: she has been obese all her life. She has tried Metformin, Trulicity, Saxenda. We gave her samples of Saxenda and help curb her appetite but insurance denied coverage. April of 2021 she was 501 lbs, the heaviest she has ever been . She has tried low carb diets, meal replacement shakes, Atkins diet, she has seen the weight loss center through [facility], medications and diets helps temporarily but she is unable to keep it down. We started her on Contrave in 2022, we also discussed bariatric surgery but she never went to the meetings. She states father diagnosed with colon cancer and she got busy . She just started the rx of Contrave January 2022 and just resumed Saxenda last week. She has noticed it curbing her appetite. She is not sure of original starting weight but she knows it was higher than 501 lbs since her lab coat did not fit anymore    Depression: she has history of depression when she was teenager but never treated. Her step father went to prison early Summer 2018  mother got depressed and had other medical problems and she felt very sad, increase in appetite and weight gain, lack of motivation and it affected her sleep ( difficulty falling asleep).   Lexapro since Nov 2018 and we added Wellbutrin Feb 2019, but she is off since on Contrave. Phq 9 is 2 today   Patient Active Problem List   Diagnosis Date Noted   Class 3 severe obesity with serious comorbidity and body mass index (BMI) of 60.0 to 69.9 in adult [facility]) 05/30/2018   Dermatofibroma  12/07/2017   Left anterior knee pain 03/17/2017   Lymphedema 08/04/2014   Acanthosis nigricans 08/02/2014   Benign essential HTN 08/02/2014   Allergic contact dermatitis 08/02/2014   Extreme obesity 08/02/2014   Infrequent menses 08/02/2014   Bilateral polycystic ovarian syndrome 08/02/2014   Allergic rhinitis, seasonal 08/02/2014   Vitamin D deficiency 10/12/2007    No past surgical history on file.  Family History  Problem Relation Age of Onset   High blood pressure Mother    Thyroid disease Mother    Depression Mother    Obesity Mother    Cancer Father 39       colon cancer   Obesity Father    Sleep apnea Father    Cancer Paternal Grandmother    Diabetes Paternal Grandfather    Diabetes Paternal Uncle     Social History   Tobacco Use   Smoking status: Never   Smokeless tobacco: Never  Substance Use Topics   Alcohol use: No    Alcohol/week: 0.0 standard drinks of alcohol     Current Outpatient Medications:    atenolol-chlorthalidone (TENORETIC) 50-25 MG tablet, Take 1 tablet by mouth daily., Disp: 90 tablet, Rfl: 1   escitalopram (LEXAPRO) 10 MG tablet, TAKE 1 TABLET BY MOUTH DAILY., Disp: 90 tablet, Rfl: 1   Insulin Pen Needle (UNIFINE PENTIPS) 31G X 6 MM MISC, Use daily with Saxenda,  Disp: 100 each, Rfl: 0   Liraglutide -Weight Management (SAXENDA) 18 MG/3ML SOPN, Inject 3 mg into the skin daily., Disp: 15 mL, Rfl: 3   Naltrexone-buPROPion HCl ER 8-90 MG TB12, Take 2 tablets by mouth in the morning and at bedtime., Disp: 120 tablet, Rfl: 2   sulfamethoxazole-trimethoprim (BACTRIM DS) 800-160 MG tablet, Take 1 tablet by mouth 2 (two) times daily., Disp: 14 tablet, Rfl: 0   VITAMIN D PO, Take 1,000 Units by mouth daily., Disp: , Rfl:   Allergies  Allergen Reactions   Penicillins     I personally reviewed active problem list, medication list, allergies, family history, social history, health maintenance with the patient/caregiver  today.   ROS  ***  Objective  There were no vitals filed for this visit.  There is no height or weight on file to calculate BMI.  Physical Exam ***  No results found for this or any previous visit (from the past 2160 hour(s)).   PHQ2/9:    04/05/2021    2:45 PM 07/16/2020   11:03 AM 06/12/2020   10:50 AM 09/10/2019    3:23 PM 02/26/2019    3:40 PM  Depression screen PHQ 2/9  Decreased Interest '1 1 1 1 1  '$ Down, Depressed, Hopeless 1 0 0 0 0  PHQ - 2 Score '2 1 1 1 1  '$ Altered sleeping 0 0 0 1 1  Tired, decreased energy 0 1 0 1 1  Change in appetite 0 0 0 1 0  Feeling bad or failure about yourself  0 0 0 0 0  Trouble concentrating 0 0 0 0 0  Moving slowly or fidgety/restless 0 0 0 0 0  Suicidal thoughts 0 0 0 0 0  PHQ-9 Score '2 2 1 4 3  '$ Difficult doing work/chores    Somewhat difficult Somewhat difficult    phq 9 is {gen pos [id number]}   Fall Risk:    04/05/2021    2:45 PM 07/16/2020   11:03 AM 06/12/2020   10:50 AM 09/10/2019    3:13 PM 02/26/2019    3:39 PM  Fall Risk   Falls in the past year? 1 0 0 0 0  Number falls in past yr: 0 0 0 0 0  Injury with Fall? 0 0 0 0 0  Risk for fall due to : No Fall Risks      Follow up Falls prevention discussed          Functional Status Survey:      Assessment & Plan  *** There are no diagnoses linked to this encounter.

## 2021-08-06 ENCOUNTER — Other Ambulatory Visit: Payer: Self-pay

## 2021-08-06 ENCOUNTER — Ambulatory Visit (INDEPENDENT_AMBULATORY_CARE_PROVIDER_SITE_OTHER): Payer: No Typology Code available for payment source | Admitting: Family Medicine

## 2021-08-06 ENCOUNTER — Other Ambulatory Visit: Payer: Self-pay | Admitting: Family Medicine

## 2021-08-06 ENCOUNTER — Encounter: Payer: Self-pay | Admitting: Family Medicine

## 2021-08-06 VITALS — BP 132/82 | HR 87 | Resp 16 | Ht 68.0 in | Wt >= 6400 oz

## 2021-08-06 DIAGNOSIS — E8881 Metabolic syndrome: Secondary | ICD-10-CM | POA: Insufficient documentation

## 2021-08-06 DIAGNOSIS — F330 Major depressive disorder, recurrent, mild: Secondary | ICD-10-CM

## 2021-08-06 DIAGNOSIS — I10 Essential (primary) hypertension: Secondary | ICD-10-CM | POA: Diagnosis not present

## 2021-08-06 DIAGNOSIS — H6123 Impacted cerumen, bilateral: Secondary | ICD-10-CM | POA: Insufficient documentation

## 2021-08-06 DIAGNOSIS — Z6841 Body Mass Index (BMI) 40.0 and over, adult: Secondary | ICD-10-CM

## 2021-08-06 MED ORDER — BUPROPION HCL ER (XL) 150 MG PO TB24
150.0000 mg | ORAL_TABLET | Freq: Every day | ORAL | 0 refills | Status: DC
  Filled 2021-08-06: qty 90, 90d supply, fill #0

## 2021-08-06 NOTE — Assessment & Plan Note (Signed)
Currently on Saxenda  She has insulin resistance with hirsutism and oligomenorrhea

## 2021-08-06 NOTE — Assessment & Plan Note (Signed)
Ear lavage today.

## 2021-08-18 ENCOUNTER — Encounter: Payer: Self-pay | Admitting: Family Medicine

## 2021-08-20 ENCOUNTER — Other Ambulatory Visit: Payer: Self-pay | Admitting: Family Medicine

## 2021-08-20 ENCOUNTER — Other Ambulatory Visit: Payer: Self-pay

## 2021-08-20 DIAGNOSIS — F330 Major depressive disorder, recurrent, mild: Secondary | ICD-10-CM

## 2021-08-20 MED ORDER — BUPROPION HCL ER (XL) 300 MG PO TB24
300.0000 mg | ORAL_TABLET | Freq: Every day | ORAL | 0 refills | Status: DC
  Filled 2021-08-20 – 2021-09-21 (×2): qty 90, 90d supply, fill #0

## 2021-08-27 ENCOUNTER — Encounter: Payer: Self-pay | Admitting: Family Medicine

## 2021-08-27 ENCOUNTER — Other Ambulatory Visit: Payer: Self-pay

## 2021-08-30 ENCOUNTER — Other Ambulatory Visit: Payer: Self-pay

## 2021-09-09 ENCOUNTER — Other Ambulatory Visit: Payer: Self-pay

## 2021-09-21 ENCOUNTER — Other Ambulatory Visit: Payer: Self-pay

## 2021-09-24 ENCOUNTER — Telehealth: Payer: Self-pay | Admitting: Family Medicine

## 2021-09-24 NOTE — Telephone Encounter (Signed)
Patient already has an active PA that does not expire until August 2024.

## 2021-09-24 NOTE — Telephone Encounter (Signed)
We have not received a PA request so we don't have a status for it. I can try to create one.

## 2021-09-24 NOTE — Telephone Encounter (Signed)
Copied from [place] [phone]. Topic: General - Other >> Sep 24, 2021  1:14 PM Sabas Sous wrote: Reason for CRM: Burlene Arnt calling from [employer] wants to know the status of the Prior Authorization for the patient's saxenda. Please advise   Best contact: [phone]

## 2021-09-25 ENCOUNTER — Encounter: Payer: Self-pay | Admitting: Family Medicine

## 2021-09-27 ENCOUNTER — Other Ambulatory Visit: Payer: Self-pay

## 2021-09-27 ENCOUNTER — Other Ambulatory Visit: Payer: Self-pay | Admitting: Family Medicine

## 2021-09-27 MED ORDER — SAXENDA 18 MG/3ML ~~LOC~~ SOPN
3.0000 mg | PEN_INJECTOR | Freq: Every day | SUBCUTANEOUS | 3 refills | Status: DC
  Filled 2021-09-27: qty 15, 30d supply, fill #0
  Filled 2021-10-22: qty 15, 30d supply, fill #1
  Filled 2021-11-23: qty 15, 30d supply, fill #2
  Filled 2021-12-21: qty 15, 30d supply, fill #3

## 2021-09-27 MED ORDER — NALTREXONE-BUPROPION HCL ER 8-90 MG PO TB12
2.0000 | ORAL_TABLET | Freq: Two times a day (BID) | ORAL | 2 refills | Status: DC
  Filled 2021-09-27: qty 120, 30d supply, fill #0
  Filled 2021-09-29: qty 78, 30d supply, fill #0
  Filled 2021-10-22: qty 120, 30d supply, fill #1
  Filled 2021-11-23: qty 120, 30d supply, fill #2
  Filled 2021-12-21: qty 120, 30d supply, fill #3

## 2021-09-29 ENCOUNTER — Other Ambulatory Visit: Payer: Self-pay

## 2021-09-29 NOTE — Progress Notes (Unsigned)
Name: Kimberly Cannon   MRN: [id number]    DOB: 03-18-1990   Date:09/30/2021       Progress Note  Subjective  Chief Complaint  Annual Exam  HPI  Patient presents for annual CPE.  Diet: eating smaller portions, snacking on healthier snacks Exercise: she has a pool and walking inside the water for 30 minutes at least 3 times a week   Last Eye Exam: not since childhood  Last Dental Exam: every 6 months   [place] Visit from 07/16/2020 in [facility]  AUDIT-C Score 0      Depression: Phq 9 is  negative    09/30/2021   10:43 AM 08/06/2021    3:32 PM 04/05/2021    2:45 PM 07/16/2020   11:03 AM 06/12/2020   10:50 AM  Depression screen PHQ 2/9  Decreased Interest 0 '1 1 1 1  ' Down, Depressed, Hopeless 0 0 1 0 0  PHQ - 2 Score 0 '1 2 1 1  ' Altered sleeping 0 0 0 0 0  Tired, decreased energy 0 0 0 1 0  Change in appetite 0 0 0 0 0  Feeling bad or failure about yourself  0 0 0 0 0  Trouble concentrating 0 0 0 0 0  Moving slowly or fidgety/restless 0 0 0 0 0  Suicidal thoughts 0 0 0 0 0  PHQ-9 Score 0 '1 2 2 1   ' Hypertension: BP Readings from Last 3 Encounters:  09/30/21 126/84  08/06/21 132/82  04/05/21 128/84   Obesity: Wt Readings from Last 3 Encounters:  09/30/21 (!) 491 lb (222.7 kg)  08/06/21 (!) 484 lb (219.5 kg)  04/05/21 (!) 498 lb (225.9 kg)   BMI Readings from Last 3 Encounters:  09/30/21 76.90 kg/m  08/06/21 73.59 kg/m  04/05/21 75.72 kg/m     Vaccines:   HPV: up to date Tdap: up to date Shingrix: N/A Pneumonia: N/A Flu: up to date COVID-52: N/A   Hep C Screening: 06/12/20 STD testing and prevention (HIV/chl/gon/syphilis): 06/09/20 Intimate partner violence: negative screen  Sexual History : never been sexually active  Menstrual History/LMP/Abnormal Bleeding: cycles are more regular since started on Saxenda, advised to track on an App  Discussed importance of follow up if any post-menopausal bleeding: not applicable   Incontinence Symptoms: negative for symptoms   Breast cancer:  - Last Mammogram: N/A - BRCA gene screening: N/A - only one relative with colon cancer - father   Osteoporosis Prevention : Discussed high calcium and vitamin D supplementation, weight bearing exercises Bone density :not applicable   Cervical cancer screening: Ordered today  Skin cancer: Discussed monitoring for atypical lesions  Colorectal cancer: N/A   Lung cancer:  Low Dose CT Chest recommended if Age 19-80 years, 20 pack-year currently smoking OR have quit w/in 15years. Patient does not qualify for screen   ECG: 06/27/17  Advanced Care Planning: A voluntary discussion about advance care planning including the explanation and discussion of advance directives.  Discussed health care proxy and Living will, and the patient was able to identify a health care proxy as mother .  Patient does not have a living will and power of attorney of health care   Lipids: Lab Results  Component Value Date   CHOL 172 06/12/2020   CHOL 177 12/28/2018   CHOL 169 06/27/2017   Lab Results  Component Value Date   HDL 52 06/12/2020   HDL 52 12/28/2018   HDL 60 06/27/2017  Lab Results  Component Value Date   LDLCALC 106 (H) 06/12/2020   LDLCALC 112 (H) 12/28/2018   LDLCALC 97 06/27/2017   Lab Results  Component Value Date   TRIG 58 06/12/2020   TRIG 52 12/28/2018   TRIG 61 06/27/2017   Lab Results  Component Value Date   CHOLHDL 3.3 06/12/2020   CHOLHDL 3.4 12/28/2018   CHOLHDL 2.9 06/09/2016   No results found for: "LDLDIRECT"  Glucose: Glucose  Date Value Ref Range Status  06/27/2017 78 65 - 99 mg/dL Final  06/24/2013 82 65 - 99 mg/dL Final   Glucose, Bld  Date Value Ref Range Status  06/12/2020 84 65 - 99 mg/dL Final    Comment:    .            Fasting reference interval .   12/28/2018 79 65 - 99 mg/dL Final    Comment:    .            Fasting reference interval .   06/09/2016 77 65 - 99 mg/dL Final     Patient Active Problem List   Diagnosis Date Noted   Metabolic syndrome 54/62/7035   Bilateral impacted cerumen 08/06/2021   Depression, major, recurrent, mild ([place]) 08/06/2021   Morbid obesity with BMI of 70 and over, adult ([place]) 08/06/2021   BMI 70 and over, adult ([place]) 05/30/2018   Dermatofibroma 12/07/2017   Left anterior knee pain 03/17/2017   Lymphedema 08/04/2014   Acanthosis nigricans 08/02/2014   Benign essential HTN 08/02/2014   Allergic contact dermatitis 08/02/2014   Infrequent menses 08/02/2014   Bilateral polycystic ovarian syndrome 08/02/2014   Allergic rhinitis, seasonal 08/02/2014   Vitamin D deficiency 10/12/2007    No past surgical history on file.  Family History  Problem Relation Age of Onset   High blood pressure Mother    Thyroid disease Mother    Depression Mother    Obesity Mother    Cancer Father 53       colon cancer   Obesity Father    Sleep apnea Father    Cancer Paternal Grandmother    Diabetes Paternal Grandfather    Diabetes Paternal Uncle     Social History   Socioeconomic History   Marital status: Single    Spouse name: Not on file   Number of children: 0   Years of education: Not on file   Highest education level: Associate degree: occupational, Hotel manager, or vocational program  Occupational History   Occupation: Actuary: [employer]  Tobacco Use   Smoking status: Never   Smokeless tobacco: Never  Vaping Use   Vaping Use: Never used  Substance and Sexual Activity   Alcohol use: No    Alcohol/week: 0.0 standard drinks of alcohol   Drug use: No   Sexual activity: Never  Other Topics Concern   Not on file  Social History Narrative   Lives with her mother and two younger sisters   Social Determinants of Health   Financial Resource Strain: Low Risk  (09/30/2021)   Overall Financial Resource Strain (CARDIA)    Difficulty of Paying Living Expenses: Not hard at all  Food  Insecurity: No Food Insecurity (09/30/2021)   Hunger Vital Sign    Worried About Running Out of Food in the Last Year: Never true    Ran Out of Food in the Last Year: Never true  Transportation Needs: No Transportation Needs (09/30/2021)   [place] -  Hydrologist (Medical): No    Lack of Transportation (Non-Medical): No  Physical Activity: Inactive (09/30/2021)   Exercise Vital Sign    Days of Exercise per Week: 0 days    Minutes of Exercise per Session: 0 min  Stress: No Stress Concern Present (09/30/2021)   [place]    Feeling of Stress : Not at all  Social Connections: Moderately Isolated (09/30/2021)   Social Connection and Isolation Panel [NHANES]    Frequency of Communication with Friends and Family: More than three times a week    Frequency of Social Gatherings with Friends and Family: Twice a week    Attends Religious Services: More than 4 times per year    Active Member of [employer] or Organizations: No    Attends Archivist Meetings: Never    Marital Status: Never married  Intimate Partner Violence: Not At Risk (09/30/2021)   Humiliation, Afraid, Rape, and Kick questionnaire    Fear of Current or Ex-Partner: No    Emotionally Abused: No    Physically Abused: No    Sexually Abused: No     Current Outpatient Medications:    atenolol-chlorthalidone (TENORETIC) 50-25 MG tablet, Take 1 tablet by mouth daily., Disp: 90 tablet, Rfl: 1   escitalopram (LEXAPRO) 10 MG tablet, TAKE 1 TABLET BY MOUTH DAILY., Disp: 90 tablet, Rfl: 1   Liraglutide -Weight Management (SAXENDA) 18 MG/3ML SOPN, Inject 3 mg into the skin daily., Disp: 15 mL, Rfl: 3   Naltrexone-buPROPion HCl ER 8-90 MG TB12, Take 2 tablets by mouth 2 (two) times daily. Start 1 tablet every morning for 7 days, then 1 tablet twice daily for 7 days, then 2 tablets every morning and one in the evening, Disp: 120 tablet, Rfl: 2    VITAMIN D PO, Take 1,000 Units by mouth daily., Disp: , Rfl:   Allergies  Allergen Reactions   Penicillins      ROS  Constitutional: Negative for fever or weight change.  Respiratory: Negative for cough and shortness of breath.   Cardiovascular: Negative for chest pain or palpitations.  Gastrointestinal: Negative for abdominal pain, no bowel changes.  Musculoskeletal: Negative for gait problem or joint swelling.  Skin: Negative for rash.  Neurological: Negative for dizziness or headache.  No other specific complaints in a complete review of systems (except as listed in HPI above).   Objective  Vitals:   09/30/21 1038  BP: 126/84  Pulse: 93  Resp: 16  SpO2: 96%  Weight: (!) 491 lb (222.7 kg)  Height: '5\' 7"'  (1.702 m)    Body mass index is 76.9 kg/m.  Physical Exam  Constitutional: Patient appears well-developed and obese  No distress.  HENT: Head: Normocephalic and atraumatic. Ears: B TMs ok, no erythema or effusion; Nose: Nose normal. Mouth/Throat: Oropharynx is clear and moist. No oropharyngeal exudate.  Eyes: Conjunctivae and EOM are normal. Pupils are equal, round, and reactive to light. No scleral icterus.  Neck: Normal range of motion. Neck supple. No JVD present. No thyromegaly present.  Cardiovascular: Normal rate, regular rhythm and normal heart sounds.  No murmur heard. No BLE edema. Pulmonary/Chest: Effort normal and breath sounds normal. No respiratory distress. Abdominal: Soft. Bowel sounds are normal, no distension. There is no tenderness. no masses Breast: no lumps or masses, no nipple discharge or rashes FEMALE GENITALIA:  External genitalia normal External urethra normal Vaginal vault normal without discharge or lesions Cervix normal without  discharge or lesions Bimanual exam normal without masses RECTAL:not done  Musculoskeletal: Normal range of motion, no joint effusions. No gross deformities Neurological: he is alert and oriented to person, place,  and time. No cranial nerve deficit. Coordination, balance, strength, speech and gait are normal.  Skin: Skin is warm and dry. Acanthosis nigricans between breasts, neck, she also has some intertrigo under breast.  No erythema.  Psychiatric: Patient has a normal mood and affect. behavior is normal. Judgment and thought content normal.   Fall Risk:    09/30/2021   10:37 AM 08/06/2021    3:32 PM 04/05/2021    2:45 PM 07/16/2020   11:03 AM 06/12/2020   10:50 AM  Fall Risk   Falls in the past year? 0 0 1 0 0  Number falls in past yr: 0 0 0 0 0  Injury with Fall? 0 0 0 0 0  Risk for fall due to : No Fall Risks No Fall Risks No Fall Risks    Follow up Falls prevention discussed Falls prevention discussed Falls prevention discussed       Functional Status Survey: Is the patient deaf or have difficulty hearing?: Yes Does the patient have difficulty seeing, even when wearing glasses/contacts?: No Does the patient have difficulty concentrating, remembering, or making decisions?: No Does the patient have difficulty walking or climbing stairs?: No Does the patient have difficulty dressing or bathing?: No Does the patient have difficulty doing errands alone such as visiting a doctor's office or shopping?: No   Assessment & Plan  1. Well adult exam   2. Cervical cancer screening  - Cytology - PAP  3. Vitamin D deficiency  - VITAMIN D 25 Hydroxy (Vit-D Deficiency, Fractures)  4. Metabolic syndrome  - Hemoglobin A1c  5. Lipid screening  - Lipid panel  6. Long-term use of high-risk medication  - CBC with Differential/Platelet - COMPLETE METABOLIC PANEL WITH GFR    -USPSTF grade A and B recommendations reviewed with patient; age-appropriate recommendations, preventive care, screening tests, etc discussed and encouraged; healthy living encouraged; see AVS for patient education given to patient -Discussed importance of 150 minutes of physical activity weekly, eat two servings of fish  weekly, eat one serving of tree nuts ( cashews, pistachios, pecans, almonds.[name]) every other day, eat 6 servings of fruit/vegetables daily and drink plenty of water and avoid sweet beverages.   -Reviewed Health Maintenance: Yes.

## 2021-09-29 NOTE — Patient Instructions (Signed)

## 2021-09-30 ENCOUNTER — Encounter: Payer: Self-pay | Admitting: Family Medicine

## 2021-09-30 ENCOUNTER — Ambulatory Visit (INDEPENDENT_AMBULATORY_CARE_PROVIDER_SITE_OTHER): Payer: No Typology Code available for payment source | Admitting: Family Medicine

## 2021-09-30 ENCOUNTER — Other Ambulatory Visit (HOSPITAL_COMMUNITY)
Admission: RE | Admit: 2021-09-30 | Discharge: 2021-09-30 | Disposition: A | Discharge status: Home / Self Care | Payer: No Typology Code available for payment source | Source: Ambulatory Visit | Attending: Family Medicine | Admitting: Family Medicine

## 2021-09-30 ENCOUNTER — Other Ambulatory Visit: Payer: Self-pay

## 2021-09-30 VITALS — BP 126/84 | HR 93 | Resp 16 | Ht 67.0 in | Wt >= 6400 oz

## 2021-09-30 DIAGNOSIS — E8881 Metabolic syndrome: Secondary | ICD-10-CM | POA: Diagnosis not present

## 2021-09-30 DIAGNOSIS — E559 Vitamin D deficiency, unspecified: Secondary | ICD-10-CM

## 2021-09-30 DIAGNOSIS — Z23 Encounter for immunization: Secondary | ICD-10-CM

## 2021-09-30 DIAGNOSIS — Z79899 Other long term (current) drug therapy: Secondary | ICD-10-CM

## 2021-09-30 DIAGNOSIS — Z13220 Encounter for screening for lipoid disorders: Secondary | ICD-10-CM

## 2021-09-30 DIAGNOSIS — Z0000 Encounter for general adult medical examination without abnormal findings: Secondary | ICD-10-CM | POA: Diagnosis not present

## 2021-09-30 DIAGNOSIS — Z124 Encounter for screening for malignant neoplasm of cervix: Secondary | ICD-10-CM | POA: Insufficient documentation

## 2021-10-01 ENCOUNTER — Encounter: Payer: Self-pay | Admitting: Family Medicine

## 2021-10-01 LAB — CBC WITH DIFFERENTIAL/PLATELET
Absolute Monocytes: 386 cells/uL (ref 200–950)
Basophils Absolute: 34 cells/uL (ref 0–200)
Basophils Relative: 0.4 %
Eosinophils Absolute: 193 cells/uL (ref 15–500)
Eosinophils Relative: 2.3 %
HCT: 40.4 % (ref 35.0–45.0)
Hemoglobin: 13.3 g/dL (ref 11.7–15.5)
Lymphs Abs: 3923 cells/uL — ABNORMAL HIGH (ref 850–3900)
MCH: 29.1 pg (ref 27.0–33.0)
MCHC: 32.9 g/dL (ref 32.0–36.0)
MCV: 88.4 fL (ref 80.0–100.0)
MPV: 10.3 fL (ref 7.5–12.5)
Monocytes Relative: 4.6 %
Neutro Abs: 3864 cells/uL (ref 1500–7800)
Neutrophils Relative %: 46 %
Platelets: 347 10*3/uL (ref 140–400)
RBC: 4.57 10*6/uL (ref 3.80–5.10)
RDW: 13.7 % (ref 11.0–15.0)
Total Lymphocyte: 46.7 %
WBC: 8.4 10*3/uL (ref 3.8–10.8)

## 2021-10-01 LAB — HEMOGLOBIN A1C
Hgb A1c MFr Bld: 5.3 % of total Hgb (ref <5.7)
Mean Plasma Glucose: 105 mg/dL
eAG (mmol/L): 5.8 mmol/L

## 2021-10-01 LAB — COMPLETE METABOLIC PANEL WITH GFR
AG Ratio: 1 (calc) (ref 1.0–2.5)
ALT: 27 U/L (ref 6–29)
AST: 19 U/L (ref 10–30)
Albumin: 3.6 g/dL (ref 3.6–5.1)
Alkaline phosphatase (APISO): 95 U/L (ref 31–125)
BUN: 13 mg/dL (ref 7–25)
CO2: 30 mmol/L (ref 20–32)
Calcium: 9.1 mg/dL (ref 8.6–10.2)
Chloride: 102 mmol/L (ref 98–110)
Creat: 0.7 mg/dL (ref 0.50–0.97)
Globulin: 3.6 g/dL (calc) (ref 1.9–3.7)
Glucose, Bld: 74 mg/dL (ref 65–99)
Potassium: 3.7 mmol/L (ref 3.5–5.3)
Sodium: 140 mmol/L (ref 135–146)
Total Bilirubin: 0.4 mg/dL (ref 0.2–1.2)
Total Protein: 7.2 g/dL (ref 6.1–8.1)
eGFR: 119 mL/min/{1.73_m2} (ref >=60)

## 2021-10-01 LAB — LIPID PANEL
Cholesterol: 176 mg/dL (ref <200)
HDL: 54 mg/dL (ref >=50)
LDL Cholesterol (Calc): 106 mg/dL (calc) — ABNORMAL HIGH
Non-HDL Cholesterol (Calc): 122 mg/dL (calc) (ref <130)
Total CHOL/HDL Ratio: 3.3 (calc) (ref <5.0)
Triglycerides: 69 mg/dL (ref <150)

## 2021-10-01 LAB — VITAMIN D 25 HYDROXY (VIT D DEFICIENCY, FRACTURES): Vit D, 25-Hydroxy: 14 ng/mL — ABNORMAL LOW (ref 30–100)

## 2021-10-04 ENCOUNTER — Other Ambulatory Visit: Payer: Self-pay

## 2021-10-04 ENCOUNTER — Other Ambulatory Visit: Payer: Self-pay | Admitting: Family Medicine

## 2021-10-04 LAB — CYTOLOGY - PAP
Comment: NEGATIVE
Diagnosis: NEGATIVE
High risk HPV: NEGATIVE

## 2021-10-04 MED ORDER — VITAMIN D (ERGOCALCIFEROL) 1.25 MG (50000 UNIT) PO CAPS
50000.0000 [IU] | ORAL_CAPSULE | ORAL | 0 refills | Status: DC
  Filled 2021-10-04: qty 12, 84d supply, fill #0

## 2021-10-21 ENCOUNTER — Telehealth: Payer: No Typology Code available for payment source | Admitting: Physician Assistant

## 2021-10-21 DIAGNOSIS — J029 Acute pharyngitis, unspecified: Secondary | ICD-10-CM

## 2021-10-21 NOTE — Progress Notes (Signed)
Patient currently in [facility] where we are not licensed so cannot complete e-visit. Sent her resources so she can be taken care of today.

## 2021-10-24 ENCOUNTER — Other Ambulatory Visit: Payer: Self-pay

## 2021-10-25 ENCOUNTER — Other Ambulatory Visit: Payer: Self-pay

## 2021-10-29 ENCOUNTER — Other Ambulatory Visit: Payer: Self-pay

## 2021-11-09 ENCOUNTER — Other Ambulatory Visit (HOSPITAL_COMMUNITY): Payer: Self-pay

## 2021-11-10 ENCOUNTER — Other Ambulatory Visit (HOSPITAL_COMMUNITY): Payer: Self-pay

## 2021-11-10 ENCOUNTER — Other Ambulatory Visit: Payer: Self-pay

## 2021-11-15 ENCOUNTER — Ambulatory Visit: Payer: No Typology Code available for payment source | Admitting: Family Medicine

## 2021-11-23 ENCOUNTER — Other Ambulatory Visit: Payer: Self-pay

## 2021-12-21 ENCOUNTER — Other Ambulatory Visit: Payer: Self-pay

## 2021-12-21 ENCOUNTER — Other Ambulatory Visit: Payer: Self-pay | Admitting: Family Medicine

## 2021-12-22 NOTE — Telephone Encounter (Signed)
Lvm for pt to call back and schedule an appt 

## 2021-12-22 NOTE — Telephone Encounter (Signed)
Lvm to patient that Per Dr Ancil Boozer patient needs a follow up / med refill appt before she can refill the request.

## 2021-12-23 ENCOUNTER — Other Ambulatory Visit: Payer: Self-pay

## 2021-12-24 NOTE — Progress Notes (Unsigned)
Name: Kimberly Cannon   MRN: [id number]    DOB: 22-Mar-1990   Date:12/27/2021       Progress Note  Subjective  Chief Complaint  Medication Refill  HPI  HTN: she has been taking medications as prescribed, she has lost weight, denies  chest pain or palpitation. BP last viist was 126/84 today is 1232/76. Advised her to check bp at work to see if it is mostly in the 120's or 130's   Morbid Obesity: she has been obese all her life. She has tried Metformin, Trulicity, Saxenda. We gave her samples of Saxenda and help curb her appetite but insurance denied coverage. April of 2021 she was 501 lbs, the heaviest she has ever been . She has tried low carb diets, meal replacement shakes, Atkins diet, she has seen the weight loss center through [facility], medications and diets helps temporarily but she is unable to keep it down.   She just started the rx of Contrave January 2022 and we added Saxenda Feb 2023  and is doing better, weight is now down from 498 lbs to 484 lbs  in June 2023 today weight is 487 lbs. She has been eating breakfast before going to work and either eats a microwave meal or eats a sandwich from the pharmacy. She is avoiding white bread and eating after 7 pm    Depression: she has history of depression when she was teenager but never treated. Her step father went to prison early Summer 2018  mother got depressed and had other medical problems and she felt very sad, increase in appetite and weight gain, lack of motivation and it affected her sleep ( difficulty falling asleep).   Lexapro since Nov 2018 and we added Wellbutrin Feb 2019, we stopped wellbutrin because she was on Contrave. She states her mood is a lot better and wants to continue current regiment   Vitamin D deficiency: last vitamin D level was 14, discussed supplementation again  Chronic left knee pain: she was seen at [facility] and had a steroid injection and symptoms improved for about 6 months, pain is getting to be daily  again and she would like to go back   Patient Active Problem List   Diagnosis Date Noted   Metabolic syndrome 35/70/1779   Bilateral impacted cerumen 08/06/2021   Depression, major, recurrent, mild ([place]) 08/06/2021   Morbid obesity with BMI of 70 and over, adult ([place]) 08/06/2021   BMI 70 and over, adult ([place]) 05/30/2018   Dermatofibroma 12/07/2017   Left anterior knee pain 03/17/2017   Lymphedema 08/04/2014   Acanthosis nigricans 08/02/2014   Benign essential HTN 08/02/2014   Allergic contact dermatitis 08/02/2014   Infrequent menses 08/02/2014   Bilateral polycystic ovarian syndrome 08/02/2014   Allergic rhinitis, seasonal 08/02/2014   Vitamin D deficiency 10/12/2007    No past surgical history on file.  Family History  Problem Relation Age of Onset   High blood pressure Mother    Thyroid disease Mother    Depression Mother    Obesity Mother    Cancer Father 3       colon cancer   Obesity Father    Sleep apnea Father    Cancer Paternal Grandmother    Diabetes Paternal Grandfather    Diabetes Paternal Uncle     Social History   Tobacco Use   Smoking status: Never   Smokeless tobacco: Never  Substance Use Topics   Alcohol use: No    Alcohol/week: 0.0 standard drinks  of alcohol     Current Outpatient Medications:    atenolol-chlorthalidone (TENORETIC) 50-25 MG tablet, Take 1 tablet by mouth daily., Disp: 90 tablet, Rfl: 1   escitalopram (LEXAPRO) 10 MG tablet, TAKE 1 TABLET BY MOUTH DAILY., Disp: 90 tablet, Rfl: 1   Liraglutide -Weight Management (SAXENDA) 18 MG/3ML SOPN, Inject 3 mg into the skin daily., Disp: 15 mL, Rfl: 3   Naltrexone-buPROPion HCl ER 8-90 MG TB12, Take 2 tablets by mouth 2 (two) times daily. Start 1 tablet every morning for 7 days, then 1 tablet twice daily for 7 days, then 2 tablets every morning and one in the evening, Disp: 120 tablet, Rfl: 2   VITAMIN D PO, Take 1,000 Units by mouth daily., Disp: , Rfl:    Vitamin D, Ergocalciferol,  (DRISDOL) 1.25 MG (50000 UNIT) CAPS capsule, Take 1 capsule (50,000 Units total) by mouth every 7 (seven) days., Disp: 12 capsule, Rfl: 0  Allergies  Allergen Reactions   Penicillins     I personally reviewed active problem list, medication list, allergies, family history, social history, health maintenance with the patient/caregiver today.   ROS  Ten systems reviewed and is negative except as mentioned in HPI   Objective  Vitals:   12/27/21 0746  BP: 132/76  Pulse: 92  Resp: 16  SpO2: 98%  Weight: (!) 487 lb (220.9 kg)  Height: _0  (1.727 m)    Body mass index is 74.05 kg/m.  Physical Exam  Constitutional: Patient appears well-developed and well-nourished. Obese  No distress.  HEENT: head atraumatic, normocephalic, pupils equal and reactive to light, neck supple Cardiovascular: Normal rate, regular rhythm and normal heart sounds.  No murmur heard. No BLE edema. Pulmonary/Chest: Effort normal and breath sounds normal. No respiratory distress. Abdominal: Soft.  There is no tenderness. Psychiatric: Patient has a normal mood and affect. behavior is normal. Judgment and thought content normal.   Recent Results (from the past 2160 hour(s))  Cytology - PAP     Status: None   Collection Time: 09/30/21 10:51 AM  Result Value Ref Range   High risk HPV Negative    Adequacy      Satisfactory for evaluation; transformation zone component PRESENT.   Diagnosis      - Negative for intraepithelial lesion or malignancy (NILM)   Comment Normal Reference Range HPV - Negative   Lipid panel     Status: Abnormal   Collection Time: 09/30/21 11:21 AM  Result Value Ref Range   Cholesterol 176 <200 mg/dL   HDL 54 > OR = 50 mg/dL   Triglycerides 69 <150 mg/dL   LDL Cholesterol (Calc) 106 (H) mg/dL (calc)    Comment: Reference range: <100 . Desirable range <100 mg/dL for primary prevention;   <70 mg/dL for patients with CHD or diabetic patients  with > or = 2 CHD risk  factors. [name] LDL-C is now calculated using the Martin-Hopkins  calculation, which is a validated novel method providing  better accuracy than the Friedewald equation in the  estimation of LDL-C.  Cresenciano Genre et al. Annamaria Helling. 7782;423(53): 2061-2068  (http://education.[facility].com/faq/FAQ164)    Total CHOL/HDL Ratio 3.3 <5.0 (calc)   Non-HDL Cholesterol (Calc) 122 <130 mg/dL (calc)    Comment: For patients with diabetes plus 1 major ASCVD risk  factor, treating to a non-HDL-C goal of <100 mg/dL  (LDL-C of <70 mg/dL) is considered a therapeutic  option.   CBC with Differential/Platelet     Status: Abnormal   Collection Time: 09/30/21 11:21  AM  Result Value Ref Range   WBC 8.4 3.8 - 10.8 Thousand/uL   RBC 4.57 3.80 - 5.10 Million/uL   Hemoglobin 13.3 11.7 - 15.5 g/dL   HCT 40.4 35.0 - 45.0 %   MCV 88.4 80.0 - 100.0 fL   MCH 29.1 27.0 - 33.0 pg   MCHC 32.9 32.0 - 36.0 g/dL   RDW 13.7 11.0 - 15.0 %   Platelets 347 140 - 400 Thousand/uL   MPV 10.3 7.5 - 12.5 fL   Neutro Abs 3,864 1,500 - 7,800 cells/uL   Lymphs Abs 3,923 (H) 850 - 3,900 cells/uL   Absolute Monocytes 386 200 - 950 cells/uL   Eosinophils Absolute 193 15 - 500 cells/uL   Basophils Absolute 34 0 - 200 cells/uL   Neutrophils Relative % 46 %   Total Lymphocyte 46.7 %   Monocytes Relative 4.6 %   Eosinophils Relative 2.3 %   Basophils Relative 0.4 %  COMPLETE METABOLIC PANEL WITH GFR     Status: None   Collection Time: 09/30/21 11:21 AM  Result Value Ref Range   Glucose, Bld 74 65 - 99 mg/dL    Comment: .            Fasting reference interval .    BUN 13 7 - 25 mg/dL   Creat 0.70 0.50 - 0.97 mg/dL   eGFR 119 > OR = 60 mL/min/1.70m   BUN/Creatinine Ratio SEE NOTE: 6 - 22 (calc)    Comment:    Not Reported: BUN and Creatinine are within    reference range. .    Sodium 140 135 - 146 mmol/L   Potassium 3.7 3.5 - 5.3 mmol/L   Chloride 102 98 - 110 mmol/L   CO2 30 20 - 32 mmol/L   Calcium 9.1 8.6 - 10.2 mg/dL    Total Protein 7.2 6.1 - 8.1 g/dL   Albumin 3.6 3.6 - 5.1 g/dL   Globulin 3.6 1.9 - 3.7 g/dL (calc)   AG Ratio 1.0 1.0 - 2.5 (calc)   Total Bilirubin 0.4 0.2 - 1.2 mg/dL   Alkaline phosphatase (APISO) 95 31 - 125 U/L   AST 19 10 - 30 U/L   ALT 27 6 - 29 U/L  Hemoglobin A1c     Status: None   Collection Time: 09/30/21 11:21 AM  Result Value Ref Range   Hgb A1c MFr Bld 5.3 <5.7 % of total Hgb    Comment: For the purpose of screening for the presence of diabetes: . <5.7%       Consistent with the absence of diabetes 5.7-6.4%    Consistent with increased risk for diabetes             (prediabetes) > or =6.5%  Consistent with diabetes . This assay result is consistent with a decreased risk of diabetes. . Currently, no consensus exists regarding use of hemoglobin A1c for diagnosis of diabetes in children. . According to American Diabetes Association (ADA) guidelines, hemoglobin A1c <7.0% represents optimal control in non-pregnant diabetic patients. Different metrics may apply to specific patient populations.  Standards of Medical Care in Diabetes(ADA). .    Mean Plasma Glucose 105 mg/dL   eAG (mmol/L) 5.8 mmol/L  VITAMIN D 25 Hydroxy (Vit-D Deficiency, Fractures)     Status: Abnormal   Collection Time: 09/30/21 11:21 AM  Result Value Ref Range   Vit D, 25-Hydroxy 14 (L) 30 - 100 ng/mL    Comment: Vitamin D Status  25-OH Vitamin D: . Deficiency:                    <20 ng/mL Insufficiency:             20 - 29 ng/mL Optimal:                 > or = 30 ng/mL . For 25-OH Vitamin D testing on patients on  D2-supplementation and patients for whom quantitation  of D2 and D3 fractions is required, the [facility]ure[facility](TM) 25-OH VIT D, (D2,D3), LC/MS/MS is recommended: order  code [id number] (patients >54yr). . See Note 1 . Note 1 . For additional information, please refer to  http://education.[facility].com/faq/FAQ199  (This link is being provided for  informational/ educational purposes only.)     PHQ2/9:    12/27/2021    7:46 AM 09/30/2021   10:43 AM 08/06/2021    3:32 PM 04/05/2021    2:45 PM 07/16/2020   11:03 AM  Depression screen PHQ 2/9  Decreased Interest 0 0 _0 Down, Depressed, Hopeless 1 0 0 1 0  PHQ - 2 Score 1 0 _1 Altered sleeping 0 0 0 0 0  Tired, decreased energy 0 0 0 0 1  Change in appetite 0 0 0 0 0  Feeling bad or failure about yourself  0 0 0 0 0  Trouble concentrating 0 0 0 0 0  Moving slowly or fidgety/restless 0 0 0 0 0  Suicidal thoughts 0 0 0 0 0  PHQ-9 Score 1 0 _2 phq 9 is negative   Fall Risk:    12/27/2021    7:45 AM 09/30/2021   10:37 AM 08/06/2021    3:32 PM 04/05/2021    2:45 PM 07/16/2020   11:03 AM  Fall Risk   Falls in the past year? 0 0 0 1 0  Number falls in past yr: 0 0 0 0 0  Injury with Fall? 0 0 0 0 0  Risk for fall due to : No Fall Risks No Fall Risks No Fall Risks No Fall Risks   Follow up Falls prevention discussed Falls prevention discussed Falls prevention discussed Falls prevention discussed       Functional Status Survey: Is the patient deaf or have difficulty hearing?: No Does the patient have difficulty seeing, even when wearing glasses/contacts?: No Does the patient have difficulty concentrating, remembering, or making decisions?: No Does the patient have difficulty walking or climbing stairs?: Yes Does the patient have difficulty dressing or bathing?: No Does the patient have difficulty doing errands alone such as visiting a doctor's office or shopping?: No    Assessment & Plan  1. Morbid obesity with BMI of 70 and over, adult ([place]  - Naltrexone-buPROPion HCl ER 8-90 MG TB12; Take 2 tablets by mouth 2 (two) times daily.  Dispense: 120 tablet; Refill: 2 - Liraglutide -Weight Management (SAXENDA) 18 MG/3ML SOPN; Inject 3 mg into the skin daily.  Dispense: 15 mL; Refill: 3 - Insulin, Free (Bioactive)  2. Depression, major, recurrent, mild  (H[facility])  - escitalopram (LEXAPRO) 10 MG tablet; Take 1 tablet (10 mg total) by mouth daily.  Dispense: 90 tablet; Refill: 1  3. Benign essential HTN  - atenolol-chlorthalidone (TENORETIC) 50-25 MG tablet; Take 1 tablet by mouth daily.  Dispense: 90 tablet; Refill: 1  4. Vitamin D deficiency  - Vitamin D, Ergocalciferol, (DRISDOL) 1.25 MG (50000 UNIT) CAPS capsule; Take 1  capsule (50,000 Units total) by mouth every 7 (seven) days.  Dispense: 12 capsule; Refill: 1 - VITAMIN D 25 Hydroxy (Vit-D Deficiency, Fractures)  5. Chronic pain of left knee  - Ambulatory referral to Orthopedic Surgery  6. Insulin resistance  - Insulin, Free (Bioactive)

## 2021-12-27 ENCOUNTER — Encounter: Payer: Self-pay | Admitting: Family Medicine

## 2021-12-27 ENCOUNTER — Ambulatory Visit (INDEPENDENT_AMBULATORY_CARE_PROVIDER_SITE_OTHER): Payer: No Typology Code available for payment source | Admitting: Family Medicine

## 2021-12-27 ENCOUNTER — Other Ambulatory Visit: Payer: Self-pay

## 2021-12-27 VITALS — BP 132/76 | HR 92 | Resp 16 | Ht 68.0 in | Wt >= 6400 oz

## 2021-12-27 DIAGNOSIS — E559 Vitamin D deficiency, unspecified: Secondary | ICD-10-CM

## 2021-12-27 DIAGNOSIS — M25562 Pain in left knee: Secondary | ICD-10-CM

## 2021-12-27 DIAGNOSIS — I10 Essential (primary) hypertension: Secondary | ICD-10-CM | POA: Diagnosis not present

## 2021-12-27 DIAGNOSIS — G8929 Other chronic pain: Secondary | ICD-10-CM

## 2021-12-27 DIAGNOSIS — F330 Major depressive disorder, recurrent, mild: Secondary | ICD-10-CM | POA: Diagnosis not present

## 2021-12-27 DIAGNOSIS — E88819 Insulin resistance, unspecified: Secondary | ICD-10-CM

## 2021-12-27 DIAGNOSIS — Z6841 Body Mass Index (BMI) 40.0 and over, adult: Secondary | ICD-10-CM

## 2021-12-27 MED ORDER — SAXENDA 18 MG/3ML ~~LOC~~ SOPN
3.0000 mg | PEN_INJECTOR | Freq: Every day | SUBCUTANEOUS | 3 refills | Status: DC
  Filled 2022-01-31: qty 15, 30d supply, fill #0
  Filled 2022-03-03: qty 15, 30d supply, fill #1

## 2021-12-27 MED ORDER — ATENOLOL-CHLORTHALIDONE 50-25 MG PO TABS
1.0000 | ORAL_TABLET | Freq: Every day | ORAL | 1 refills | Status: DC
  Filled 2021-12-27 – 2022-01-31 (×2): qty 90, 90d supply, fill #0

## 2021-12-27 MED ORDER — VITAMIN D (ERGOCALCIFEROL) 1.25 MG (50000 UNIT) PO CAPS
50000.0000 [IU] | ORAL_CAPSULE | ORAL | 1 refills | Status: DC
  Filled 2021-12-27: qty 12, 84d supply, fill #0
  Filled 2022-04-10: qty 12, 84d supply, fill #1

## 2021-12-27 MED ORDER — NALTREXONE-BUPROPION HCL ER 8-90 MG PO TB12
2.0000 | ORAL_TABLET | Freq: Two times a day (BID) | ORAL | 2 refills | Status: DC
  Filled 2021-12-27: qty 120, 30d supply, fill #0
  Filled 2022-02-03 – 2022-02-04 (×2): qty 120, 30d supply, fill #1
  Filled 2022-03-14 (×2): qty 120, 30d supply, fill #2

## 2021-12-27 MED ORDER — ESCITALOPRAM OXALATE 10 MG PO TABS
10.0000 mg | ORAL_TABLET | Freq: Every day | ORAL | 1 refills | Status: DC
  Filled 2021-12-27 – 2022-01-31 (×2): qty 90, 90d supply, fill #0

## 2021-12-29 ENCOUNTER — Ambulatory Visit (INDEPENDENT_AMBULATORY_CARE_PROVIDER_SITE_OTHER): Payer: No Typology Code available for payment source | Admitting: Orthopaedic Surgery

## 2021-12-29 ENCOUNTER — Encounter: Payer: Self-pay | Admitting: Orthopaedic Surgery

## 2021-12-29 DIAGNOSIS — M25562 Pain in left knee: Secondary | ICD-10-CM | POA: Diagnosis not present

## 2021-12-29 MED ORDER — BUPIVACAINE HCL 0.25 % IJ SOLN
2.0000 mL | INTRAMUSCULAR | Status: AC | PRN
  Administered 2021-12-29: 2 mL via INTRA_ARTICULAR

## 2021-12-29 MED ORDER — LIDOCAINE HCL 1 % IJ SOLN
2.0000 mL | INTRAMUSCULAR | Status: AC | PRN
  Administered 2021-12-29: 2 mL

## 2021-12-29 MED ORDER — METHYLPREDNISOLONE ACETATE 40 MG/ML IJ SUSP
80.0000 mg | INTRAMUSCULAR | Status: AC | PRN
  Administered 2021-12-29: 80 mg via INTRA_ARTICULAR

## 2021-12-29 NOTE — Progress Notes (Signed)
Office Visit Note   Patient: Kimberly Cannon           Date of Birth: 01-27-1991           MRN: [id number] Visit Date: 12/29/2021              Requested by: Steele Sizer, [address] [place],  [place] [address] PCP: Steele Sizer, MD   Assessment & Plan: Visit Diagnoses:  1. Left anterior knee pain     Plan: Kimberly Cannon was seen in 2019 for evaluation of left knee pain.  Films at that time demonstrated some mild decrease in the medial joint space consistent with arthritis and some changes about the patellofemoral joint.  Cortisone was injected into her left knee and she notes that it made a big difference for period of time.  She is now experiencing recurrent symptoms without history of injury or trauma.  Her present BMI is 74 with a weight of 487 pounds and she is on a medicine to help with the weight loss she is lost about 12 pounds over the last several months.  She was having little bit of tenderness along the medial compartment of her left knee.  I did not think  x-rays are necessary today.  Obviously her knees are large so I used a spinal needle for the injection.  We will plan to see her back as needed  Follow-Up Instructions: Return if symptoms worsen or fail to improve.   Orders:  No orders of the defined types were placed in this encounter.  No orders of the defined types were placed in this encounter.     Procedures: Large Joint Inj: L knee on 12/29/2021 3:52 PM Indications: pain and diagnostic evaluation Details: 25 G 3.5 in needle, anteromedial approach  Arthrogram: No  Medications: 2 mL lidocaine 1 %; 80 mg methylPREDNISolone acetate 40 MG/ML; 2 mL bupivacaine 0.25 % Procedure, treatment alternatives, risks and benefits explained, specific risks discussed. Consent was given by the patient. Patient was prepped and draped in the usual sterile fashion.       Clinical Data: No additional findings.   Subjective: Chief Complaint   Patient presents with   Left Knee - Pain  Patient presents today for left knee pain. She said that it started initially with a fall 3 years ago. She has received a cortisone injection in the past with Kimberly Cannon and it helped. She wants to get another injection today. Her pain is located anteriorly and with flexion. She takes Tylenol for pain relief.   HPI  Review of Systems   Objective: Vital Signs: There were no vitals taken for this visit.  Physical Exam Constitutional:      Appearance: She is well-developed.  Eyes:     Pupils: Pupils are equal, round, and reactive to light.  Pulmonary:     Effort: Pulmonary effort is normal.  Skin:    General: Skin is warm and dry.  Neurological:     Mental Status: She is alert and oriented to person, place, and time.  Psychiatric:        Behavior: Behavior normal.     Ortho Exam wake alert and oriented x3.  Comfortable sitting.  BMI 74.  Weight 487.  Large knees.  Left knee was without effusion as best I could tell.  There was full extension and about 90 degrees of flexion.  No instability.  Some patella crepitation but no pain with compression.  Some mild medial joint  pain.  No pain laterally.  No patella pain.  No calf discomfort.  No popliteal discomfort.  Walks without ambulatory aid  Specialty Comments:  No specialty comments available.  Imaging: No results found.   PMFS History: Patient Active Problem List   Diagnosis Date Noted   Metabolic syndrome 89/38/1017   Bilateral impacted cerumen 08/06/2021   Depression, major, recurrent, mild ([place]) 08/06/2021   Morbid obesity with BMI of 70 and over, adult ([place]) 08/06/2021   BMI 70 and over, adult ([place]) 05/30/2018   Dermatofibroma 12/07/2017   Left anterior knee pain 03/17/2017   Lymphedema 08/04/2014   Acanthosis nigricans 08/02/2014   Benign essential HTN 08/02/2014   Allergic contact dermatitis 08/02/2014   Infrequent menses 08/02/2014   Bilateral polycystic ovarian  syndrome 08/02/2014   Allergic rhinitis, seasonal 08/02/2014   Vitamin D deficiency 10/12/2007   Past Medical History:  Diagnosis Date   Acanthosis nigricans    Allergy    Depression    Elevated serum protein level    Hypertension    Knee pain    left   Morbid obesity with BMI of 60.0-69.9, adult ([facility]CC)    Oligomenorrhea    PCOS (polycystic ovarian syndrome)    Vitamin D deficiency     Family History  Problem Relation Age of Onset   High blood pressure Mother    Thyroid disease Mother    Depression Mother    Obesity Mother    Cancer Father 76       colon cancer   Obesity Father    Sleep apnea Father    Cancer Paternal Grandmother    Diabetes Paternal Grandfather    Diabetes Paternal Uncle     History reviewed. No pertinent surgical history. Social History   Occupational History   Occupation: Actuary: [employer]  Tobacco Use   Smoking status: Never   Smokeless tobacco: Never  Vaping Use   Vaping Use: Never used  Substance and Sexual Activity   Alcohol use: No    Alcohol/week: 0.0 standard drinks of alcohol   Drug use: No   Sexual activity: Never

## 2022-01-31 ENCOUNTER — Other Ambulatory Visit: Payer: Self-pay

## 2022-02-01 ENCOUNTER — Other Ambulatory Visit: Payer: Self-pay

## 2022-02-03 ENCOUNTER — Other Ambulatory Visit: Payer: Self-pay

## 2022-02-11 ENCOUNTER — Telehealth: Payer: No Typology Code available for payment source | Admitting: Nurse Practitioner

## 2022-02-11 DIAGNOSIS — M545 Low back pain, unspecified: Secondary | ICD-10-CM | POA: Diagnosis not present

## 2022-02-11 MED ORDER — BACLOFEN 10 MG PO TABS: 10.0000 mg | ORAL_TABLET | Freq: Three times a day (TID) | ORAL | 0 refills | Status: DC

## 2022-02-11 MED ORDER — NAPROXEN 500 MG PO TABS: 500.0000 mg | ORAL_TABLET | Freq: Two times a day (BID) | ORAL | 0 refills | Status: DC

## 2022-02-11 NOTE — Progress Notes (Signed)

## 2022-02-15 ENCOUNTER — Other Ambulatory Visit: Payer: Self-pay

## 2022-02-22 ENCOUNTER — Telehealth: Payer: 59 | Admitting: Nurse Practitioner

## 2022-02-22 ENCOUNTER — Other Ambulatory Visit (HOSPITAL_COMMUNITY): Payer: Self-pay

## 2022-02-22 DIAGNOSIS — H66001 Acute suppurative otitis media without spontaneous rupture of ear drum, right ear: Secondary | ICD-10-CM

## 2022-02-22 MED ORDER — AZITHROMYCIN 250 MG PO TABS
ORAL_TABLET | ORAL | 0 refills | Status: AC
  Filled 2022-02-22: qty 6, 5d supply, fill #0

## 2022-02-22 NOTE — Progress Notes (Signed)
E-Visit for Ear Pain - Acute Otitis Media   We are sorry that you are not feeling well. Here is how we plan to help!  Based on what you have shared with me it looks like you have Acute Otitis Media.  Acute Otitis Media is an infection of the middle or "inner" ear. This type of infection can cause redness, inflammation, and fluid buildup behind the tympanic membrane (ear drum).  The usual symptoms include: Earache/Pain Fever Upper respiratory symptoms Lack of energy/Fatigue/Malaise Slight hearing loss gradually worsening- if the inner ear fills with fluid What causes middle ear infections? Most middle ear infections occur when an infection such as a cold, leads to a build-up of mucus in the middle ear and causes the Eustachian tube (a thin tube that runs from the middle ear to the back of the nose) to become swollen or blocked.   This means mucus can't drain away properly, making it easier for an infection to spread into the middle ear.  How middle ear infections are treated: Most ear infections clear up within three to five days and don't need any specific treatment. If necessary, tylenol or ibuprofen should be used to relieve pain and a high temperature.  If you develop a fever higher than 102, or any significantly worsening symptoms, this could indicate a more serious infection moving to the middle/inner and needs face to face evaluation in an office by a provider.   Antibiotics aren't routinely used to treat middle ear infections, although they may occasionally be prescribed if symptoms persist or are particularly severe. Given your presentation,   I have prescribed Azithromycin 250 mg two tablets by mouth on day 1, then 1 tablet by mouth daily until completed    Your symptoms should improve over the next 3 days and should resolve in about 7 days. Be sure to complete ALL of the prescription(s) given.  HOME CARE: Wash your hands frequently. If you are prescribed an ear drop, do not  place the tip of the bottle on your ear or touch it with your fingers. You can take Acetaminophen 650 mg every 4-6 hours as needed for pain.  If pain is severe or moderate, you can apply a heating pad (set on low) or hot water bottle (wrapped in a towel) to outer ear for 20 minutes.  This will also increase drainage.  GET HELP RIGHT AWAY IF: Fever is over 102.2 degrees. You develop progressive ear pain or hearing loss. Ear symptoms persist longer than 3 days after treatment.  MAKE SURE YOU: Understand these instructions. Will watch your condition. Will get help right away if you are not doing well or get worse.  Thank you for choosing an e-visit.  Your e-visit answers were reviewed by a board certified advanced clinical practitioner to complete your personal care plan. Depending upon the condition, your plan could have included both over the counter or prescription medications.  Please review your pharmacy choice. Make sure the pharmacy is open so you can pick up the prescription now. If there is a problem, you may contact your provider through [employer] and have the prescription routed to another pharmacy.  Your safety is important to [place]. If you have drug allergies check your prescription carefully.   For the next 24 hours you can use MyChart to ask questions about today's visit, request a non-urgent call back, or ask for a work or school excuse. You will get an email with a survey after your eVisit asking about  your experience. We would appreciate your feedback. I hope that your e-visit has been valuable and will aid in your recovery.  Meds ordered this encounter  Medications   azithromycin (ZITHROMAX) 250 MG tablet    Sig: Take 2 tablets on day 1, then 1 tablet daily on days 2 through 5    Dispense:  6 tablet    Refill:  0     I spent approximately 5 minutes reviewing the patient's history, current symptoms and coordinating their care today.

## 2022-03-04 ENCOUNTER — Other Ambulatory Visit: Payer: Self-pay

## 2022-03-14 ENCOUNTER — Other Ambulatory Visit: Payer: Self-pay

## 2022-03-29 ENCOUNTER — Ambulatory Visit: Payer: Self-pay | Admitting: Family Medicine

## 2022-04-06 ENCOUNTER — Other Ambulatory Visit (HOSPITAL_COMMUNITY): Payer: Self-pay

## 2022-04-10 ENCOUNTER — Encounter: Payer: Self-pay | Admitting: Family Medicine

## 2022-04-10 ENCOUNTER — Other Ambulatory Visit: Payer: Self-pay | Admitting: Family Medicine

## 2022-04-10 ENCOUNTER — Other Ambulatory Visit: Payer: Self-pay

## 2022-04-11 ENCOUNTER — Other Ambulatory Visit: Payer: Self-pay

## 2022-04-13 NOTE — Progress Notes (Unsigned)
There were no vitals taken for this visit.   Subjective:    Patient ID: Kimberly Cannon, female    DOB: 08/19/1990, 32 y.o.   MRN: [id number]  HPI: Kimberly Cannon is a 32 y.o. female  No chief complaint on file.   Relevant past medical, surgical, family and social history reviewed and updated as indicated. Interim medical history since our last visit reviewed. Allergies and medications reviewed and updated.  Review of Systems  Constitutional: Negative for fever or weight change.  Respiratory: Negative for cough and shortness of breath.   Cardiovascular: Negative for chest pain or palpitations.  Gastrointestinal: Negative for abdominal pain, no bowel changes.  Musculoskeletal: Negative for gait problem or joint swelling.  Skin: Negative for rash.  Neurological: Negative for dizziness or headache.  No other specific complaints in a complete review of systems (except as listed in HPI above).      Objective:    There were no vitals taken for this visit.  Wt Readings from Last 3 Encounters:  12/27/21 (!) 487 lb (220.9 kg)  09/30/21 (!) 491 lb (222.7 kg)  08/06/21 (!) 484 lb (219.5 kg)    Physical Exam  Constitutional: Patient appears well-developed and well-nourished. Obese *** No distress.  HEENT: head atraumatic, normocephalic, pupils equal and reactive to light, ears ***, neck supple, throat within normal limits Cardiovascular: Normal rate, regular rhythm and normal heart sounds.  No murmur heard. No BLE edema. Pulmonary/Chest: Effort normal and breath sounds normal. No respiratory distress. Abdominal: Soft.  There is no tenderness. Psychiatric: Patient has a normal mood and affect. behavior is normal. Judgment and thought content normal.  Results for orders placed or performed in visit on 09/30/21  Lipid panel  Result Value Ref Range   Cholesterol 176 <200 mg/dL   HDL 54 > OR = 50 mg/dL   Triglycerides 69 <150 mg/dL   LDL Cholesterol (Calc) 106 (H) mg/dL  (calc)   Total CHOL/HDL Ratio 3.3 <5.0 (calc)   Non-HDL Cholesterol (Calc) 122 <130 mg/dL (calc)  CBC with Differential/Platelet  Result Value Ref Range   WBC 8.4 3.8 - 10.8 Thousand/uL   RBC 4.57 3.80 - 5.10 Million/uL   Hemoglobin 13.3 11.7 - 15.5 g/dL   HCT 40.4 35.0 - 45.0 %   MCV 88.4 80.0 - 100.0 fL   MCH 29.1 27.0 - 33.0 pg   MCHC 32.9 32.0 - 36.0 g/dL   RDW 13.7 11.0 - 15.0 %   Platelets 347 140 - 400 Thousand/uL   MPV 10.3 7.5 - 12.5 fL   Neutro Abs 3,864 1,500 - 7,800 cells/uL   Lymphs Abs 3,923 (H) 850 - 3,900 cells/uL   Absolute Monocytes 386 200 - 950 cells/uL   Eosinophils Absolute 193 15 - 500 cells/uL   Basophils Absolute 34 0 - 200 cells/uL   Neutrophils Relative % 46 %   Total Lymphocyte 46.7 %   Monocytes Relative 4.6 %   Eosinophils Relative 2.3 %   Basophils Relative 0.4 %  COMPLETE METABOLIC PANEL WITH GFR  Result Value Ref Range   Glucose, Bld 74 65 - 99 mg/dL   BUN 13 7 - 25 mg/dL   Creat 0.70 0.50 - 0.97 mg/dL   eGFR 119 > OR = 60 mL/min/1.75m   BUN/Creatinine Ratio SEE NOTE: 6 - 22 (calc)   Sodium 140 135 - 146 mmol/L   Potassium 3.7 3.5 - 5.3 mmol/L   Chloride 102 98 - 110 mmol/L   CO2 30 20 -  32 mmol/L   Calcium 9.1 8.6 - 10.2 mg/dL   Total Protein 7.2 6.1 - 8.1 g/dL   Albumin 3.6 3.6 - 5.1 g/dL   Globulin 3.6 1.9 - 3.7 g/dL (calc)   AG Ratio 1.0 1.0 - 2.5 (calc)   Total Bilirubin 0.4 0.2 - 1.2 mg/dL   Alkaline phosphatase (APISO) 95 31 - 125 U/L   AST 19 10 - 30 U/L   ALT 27 6 - 29 U/L  Hemoglobin A1c  Result Value Ref Range   Hgb A1c MFr Bld 5.3 <5.7 % of total Hgb   Mean Plasma Glucose 105 mg/dL   eAG (mmol/L) 5.8 mmol/L  VITAMIN D 25 Hydroxy (Vit-D Deficiency, Fractures)  Result Value Ref Range   Vit D, 25-Hydroxy 14 (L) 30 - 100 ng/mL  Cytology - PAP  Result Value Ref Range   High risk HPV Negative    Adequacy      Satisfactory for evaluation; transformation zone component PRESENT.   Diagnosis      - Negative for  intraepithelial lesion or malignancy (NILM)   Comment Normal Reference Range HPV - Negative       Assessment & Plan:   Problem List Items Addressed This Visit   None    Follow up plan: No follow-ups on file.

## 2022-04-14 ENCOUNTER — Other Ambulatory Visit: Payer: Self-pay

## 2022-04-14 ENCOUNTER — Encounter: Payer: Self-pay | Admitting: Nurse Practitioner

## 2022-04-14 ENCOUNTER — Ambulatory Visit (INDEPENDENT_AMBULATORY_CARE_PROVIDER_SITE_OTHER): Payer: 59 | Admitting: Nurse Practitioner

## 2022-04-14 VITALS — BP 132/84 | HR 89 | Temp 98.0°F | Resp 18 | Ht 68.0 in | Wt >= 6400 oz

## 2022-04-14 DIAGNOSIS — E88819 Insulin resistance, unspecified: Secondary | ICD-10-CM | POA: Diagnosis not present

## 2022-04-14 DIAGNOSIS — Z6841 Body Mass Index (BMI) 40.0 and over, adult: Secondary | ICD-10-CM | POA: Diagnosis not present

## 2022-04-14 DIAGNOSIS — G8929 Other chronic pain: Secondary | ICD-10-CM

## 2022-04-14 DIAGNOSIS — I10 Essential (primary) hypertension: Secondary | ICD-10-CM

## 2022-04-14 DIAGNOSIS — E559 Vitamin D deficiency, unspecified: Secondary | ICD-10-CM | POA: Diagnosis not present

## 2022-04-14 DIAGNOSIS — M25562 Pain in left knee: Secondary | ICD-10-CM

## 2022-04-14 DIAGNOSIS — F330 Major depressive disorder, recurrent, mild: Secondary | ICD-10-CM

## 2022-04-14 MED ORDER — NALTREXONE-BUPROPION HCL ER 8-90 MG PO TB12
2.0000 | ORAL_TABLET | Freq: Two times a day (BID) | ORAL | 2 refills | Status: DC
  Filled 2022-04-14: qty 120, 30d supply, fill #0
  Filled 2022-05-18 (×2): qty 120, 30d supply, fill #1
  Filled 2022-06-16: qty 120, 30d supply, fill #2

## 2022-04-14 MED ORDER — ESCITALOPRAM OXALATE 10 MG PO TABS
10.0000 mg | ORAL_TABLET | Freq: Every day | ORAL | 1 refills | Status: DC
  Filled 2022-04-14: qty 90, 90d supply, fill #0

## 2022-04-14 MED ORDER — ATENOLOL-CHLORTHALIDONE 50-25 MG PO TABS
1.0000 | ORAL_TABLET | Freq: Every day | ORAL | 1 refills | Status: DC
  Filled 2022-04-14: qty 90, 90d supply, fill #0

## 2022-04-14 MED ORDER — NAPROXEN 500 MG PO TABS
500.0000 mg | ORAL_TABLET | Freq: Two times a day (BID) | ORAL | 1 refills | Status: DC | PRN
  Filled 2022-04-14: qty 60, 30d supply, fill #0

## 2022-04-14 NOTE — Assessment & Plan Note (Signed)
Continue taking atenolol-chlorthalidone 50-25 mg daily.

## 2022-04-14 NOTE — Assessment & Plan Note (Signed)
Continue taking contrave, continue lifestyle modification

## 2022-04-14 NOTE — Assessment & Plan Note (Signed)
Continue taking lexapro 10 mg daily.

## 2022-04-20 LAB — VITAMIN D 25 HYDROXY (VIT D DEFICIENCY, FRACTURES): Vit D, 25-Hydroxy: 41 ng/mL (ref 30–100)

## 2022-04-20 LAB — INSULIN, FREE (BIOACTIVE): Insulin, Free: 14.4 u[IU]/mL (ref 1.5–14.9)

## 2022-05-18 ENCOUNTER — Other Ambulatory Visit (HOSPITAL_COMMUNITY): Payer: Self-pay

## 2022-06-28 ENCOUNTER — Ambulatory Visit: Payer: 59 | Admitting: Nurse Practitioner

## 2022-07-18 ENCOUNTER — Other Ambulatory Visit: Payer: Self-pay | Admitting: Nurse Practitioner

## 2022-07-19 ENCOUNTER — Other Ambulatory Visit: Payer: Self-pay

## 2022-07-19 ENCOUNTER — Other Ambulatory Visit (HOSPITAL_COMMUNITY): Payer: Self-pay

## 2022-07-19 MED ORDER — CONTRAVE 8-90 MG PO TB12
2.0000 | ORAL_TABLET | Freq: Two times a day (BID) | ORAL | 0 refills | Status: DC
Start: 2022-07-19 — End: 2022-07-26
  Filled 2022-07-19: qty 120, 30d supply, fill #0

## 2022-07-19 NOTE — Telephone Encounter (Signed)
Requested medication (s) are due for refill today: yes  Requested medication (s) are on the active medication list: yes  Last refill:  04/14/22  Future visit scheduled: yes  Notes to clinic:   Medication not assigned to a protocol, review manually.      Requested Prescriptions  Pending Prescriptions Disp Refills   Naltrexone-buPROPion HCl ER (CONTRAVE) 8-90 MG TB12 120 tablet 2    Sig: Take 2 tablets by mouth 2 (two) times daily.     Off-Protocol Failed - 07/18/2022 12:26 PM      Failed - Medication not assigned to a protocol, review manually.      Passed - Valid encounter within last 12 months    Recent Outpatient Visits           3 months ago Chronic pain of left knee   [facility] Della Goo F, FNP   6 months ago Morbid obesity with BMI of 70 and over, adult [facility])   [facility] Alba Cory, MD   9 months ago Well adult exam   [facility] Alba Cory, MD   11 months ago Morbid obesity with BMI of 70 and over, adult [facility])   [facility] Alba Cory, MD   1 year ago Benign essential HTN   [facility] Alba Cory, MD       Future Appointments             In 1 week Alba Cory, MD [facility], [facility]

## 2022-07-22 ENCOUNTER — Other Ambulatory Visit (HOSPITAL_COMMUNITY): Payer: Self-pay

## 2022-07-25 NOTE — Progress Notes (Unsigned)
Name: Kimberly Cannon   MRN: [id number]    DOB: 26-Mar-1990   Date:07/26/2022       Progress Note  Subjective  Chief Complaint  Medication Refill  HPI  HTN: she has been taking medications as prescribed, she has lost weight, denies  chest pain or palpitation. BP has been in the high 120's to low 130's , discussed switching to bid , she prefers trying to lose more weight and cut down on caffeine first    Morbid Obesity: she has been obese all her life. She has tried Metformin, Trulicity, Saxenda. We gave her samples of Saxenda and help curb her appetite but insurance denied coverage. April of 2021 she was 501 lbs, the heaviest she has ever been . She has tried low carb diets, meal replacement shakes, Atkins diet, she has seen the weight loss center through [facility], medications and diets helps temporarily but she is unable to keep it down.   She just started the rx of Contrave January 2022 and we added Saxenda Feb 2023  and was  doing better, weight is now down from 498 lbs to 484 lbs  in June 2023  weight went up to 487 lbs insurance stopped paying for Saxenda and she has been only taking  Contrave since end of 2023 weight is now 464 lbs . She is cutting down on sweet beverages.  Discussed regular physical activity    Depression: she has history of depression when she was teenager but never treated. Her step father went to prison early Summer 2018  mother got depressed and had other medical problems and she felt very sad, increase in appetite and weight gain, lack of motivation and it affected her sleep.  She has been taking Lexapro since Nov 2018 and we added Wellbutrin Feb 2019, we stopped wellbutrin because she is now on Contrave. She still has difficulty difficulty sleeping , taking melatonin gummy prn , discussed medication but she states she will try cutting down on caffeine at least 4 hours before bed time   Vitamin D deficiency: last vitamin D level was 14, she states she prefers rx vitamin D    Chronic left knee pain: she was seen at [facility] and had a steroid injection and symptoms improved for about 6 months, the pain returned and was daily again so she had another injection and pain is control again - pain level today is zero , it is intermittent now and depends on movement.   Patient Active Problem List   Diagnosis Date Noted   Metabolic syndrome 08/06/2021   Bilateral impacted cerumen 08/06/2021   Depression, major, recurrent, mild (HCC) 08/06/2021   Morbid obesity with BMI of 70 and over, adult (HCC) 08/06/2021   BMI 70 and over, adult (HCC) 05/30/2018   Dermatofibroma 12/07/2017   Left anterior knee pain 03/17/2017   Lymphedema 08/04/2014   Acanthosis nigricans 08/02/2014   Benign essential HTN 08/02/2014   Allergic contact dermatitis 08/02/2014   Infrequent menses 08/02/2014   Bilateral polycystic ovarian syndrome 08/02/2014   Allergic rhinitis, seasonal 08/02/2014   Vitamin D deficiency 10/12/2007    No past surgical history on file.  Family History  Problem Relation Age of Onset   High blood pressure Mother    Thyroid disease Mother    Depression Mother    Obesity Mother    Cancer Father 29       colon cancer   Obesity Father    Sleep apnea Father    Cancer  Paternal Grandmother    Diabetes Paternal Grandfather    Diabetes Paternal Uncle     Social History   Tobacco Use   Smoking status: Never   Smokeless tobacco: Never  Substance Use Topics   Alcohol use: No    Alcohol/week: 0.0 standard drinks of alcohol     Current Outpatient Medications:    atenolol-chlorthalidone (TENORETIC) 50-25 MG tablet, Take 1 tablet by mouth daily., Disp: 90 tablet, Rfl: 1   escitalopram (LEXAPRO) 10 MG tablet, Take 1 tablet (10 mg total) by mouth daily., Disp: 90 tablet, Rfl: 1   Naltrexone-buPROPion HCl ER (CONTRAVE) 8-90 MG TB12, Take 2 tablets by mouth 2 (two) times daily., Disp: 120 tablet, Rfl: 0   Vitamin D, Ergocalciferol, (DRISDOL) 1.25 MG (50000  UNIT) CAPS capsule, Take 1 capsule (50,000 Units total) by mouth every 7 (seven) days., Disp: 12 capsule, Rfl: 1   naproxen (NAPROSYN) 500 MG tablet, Take 1 tablet (500 mg total) by mouth 2 (two) times daily as needed for moderate pain. (Patient not taking: Reported on 07/26/2022), Disp: 60 tablet, Rfl: 1  Allergies  Allergen Reactions   Penicillins     I personally reviewed active problem list, medication list, allergies, family history, social history, health maintenance with the patient/caregiver today.   ROS  Ten systems reviewed and is negative except as mentioned in HPI    Objective  Vitals:   07/26/22 1455  BP: 132/80  Pulse: 90  Resp: 16  SpO2: 96%  Weight: (!) 464 lb (210.5 kg)  Height: 5\' 8"  (1.727 m)    Body mass index is 70.55 kg/m.  Physical Exam  Constitutional: Patient appears well-developed and well-nourished. Obese  No distress.  HEENT: head atraumatic, normocephalic, pupils equal and reactive to light,, neck supple Cardiovascular: Normal rate, regular rhythm and normal heart sounds.  No murmur heard. No BLE edema. Pulmonary/Chest: Effort normal and breath sounds normal. No respiratory distress. Abdominal: Soft.  There is no tenderness. Psychiatric: Patient has a normal mood and affect. behavior is normal. Judgment and thought content normal.   PHQ2/9:    07/26/2022    2:55 PM 04/14/2022    3:34 PM 12/27/2021    7:46 AM 09/30/2021   10:43 AM 08/06/2021    3:32 PM  Depression screen PHQ 2/9  Decreased Interest 1 0 0 0 1  Down, Depressed, Hopeless 1 0 1 0 0  PHQ - 2 Score 2 0 1 0 1  Altered sleeping 0 0 0 0 0  Tired, decreased energy 1 1 0 0 0  Change in appetite 0 0 0 0 0  Feeling bad or failure about yourself  0 0 0 0 0  Trouble concentrating 0 0 0 0 0  Moving slowly or fidgety/restless 0 0 0 0 0  Suicidal thoughts 0 0 0 0 0  PHQ-9 Score 3 1 1  0 1  Difficult doing work/chores  Not difficult at all       phq 9 is positive   Fall Risk:     07/26/2022    2:55 PM 04/14/2022    3:34 PM 12/27/2021    7:45 AM 09/30/2021   10:37 AM 08/06/2021    3:32 PM  Fall Risk   Falls in the past year? 1 1 0 0 0  Number falls in past yr: 0 1 0 0 0  Injury with Fall? 0 0 0 0 0  Risk for fall due to : No Fall Risks History of fall(s);Impaired balance/gait No Fall  Risks No Fall Risks No Fall Risks  Follow up Falls prevention discussed  Falls prevention discussed Falls prevention discussed Falls prevention discussed      Functional Status Survey: Is the patient deaf or have difficulty hearing?: No Does the patient have difficulty seeing, even when wearing glasses/contacts?: No Does the patient have difficulty concentrating, remembering, or making decisions?: No Does the patient have difficulty walking or climbing stairs?: Yes Does the patient have difficulty dressing or bathing?: No Does the patient have difficulty doing errands alone such as visiting a doctor's office or shopping?: No    Assessment & Plan  1. Depression, major, recurrent, mild (HCC)  - escitalopram (LEXAPRO) 10 MG tablet; Take 1 tablet (10 mg total) by mouth daily.  Dispense: 90 tablet; Refill: 1  2. Morbid obesity with BMI of 70 and over, adult (HCC)  - Naltrexone-buPROPion HCl ER (CONTRAVE) 8-90 MG TB12; Take 2 tablets by mouth 2 (two) times daily.  Dispense: 120 tablet; Refill: 2  3. Vitamin D deficiency  - Vitamin D, Ergocalciferol, (DRISDOL) 1.25 MG (50000 UNIT) CAPS capsule; Take 1 capsule (50,000 Units total) by mouth every 7 (seven) days.  Dispense: 12 capsule; Refill: 1  4. Benign essential HTN  - atenolol-chlorthalidone (TENORETIC) 50-25 MG tablet; Take 1 tablet by mouth daily.  Dispense: 90 tablet; Refill: 1  5. Metabolic syndrome   6. Need for Tdap vaccination  - Tdap vaccine greater than or equal to 7yo IM

## 2022-07-26 ENCOUNTER — Other Ambulatory Visit: Payer: Self-pay

## 2022-07-26 ENCOUNTER — Encounter: Payer: Self-pay | Admitting: Family Medicine

## 2022-07-26 ENCOUNTER — Ambulatory Visit (INDEPENDENT_AMBULATORY_CARE_PROVIDER_SITE_OTHER): Payer: 59 | Admitting: Family Medicine

## 2022-07-26 VITALS — BP 132/80 | HR 90 | Resp 16 | Ht 68.0 in | Wt >= 6400 oz

## 2022-07-26 DIAGNOSIS — Z23 Encounter for immunization: Secondary | ICD-10-CM

## 2022-07-26 DIAGNOSIS — E8881 Metabolic syndrome: Secondary | ICD-10-CM | POA: Diagnosis not present

## 2022-07-26 DIAGNOSIS — E559 Vitamin D deficiency, unspecified: Secondary | ICD-10-CM | POA: Diagnosis not present

## 2022-07-26 DIAGNOSIS — I10 Essential (primary) hypertension: Secondary | ICD-10-CM | POA: Diagnosis not present

## 2022-07-26 DIAGNOSIS — Z6841 Body Mass Index (BMI) 40.0 and over, adult: Secondary | ICD-10-CM | POA: Diagnosis not present

## 2022-07-26 DIAGNOSIS — F330 Major depressive disorder, recurrent, mild: Secondary | ICD-10-CM

## 2022-07-26 MED ORDER — CONTRAVE 8-90 MG PO TB12
2.0000 | ORAL_TABLET | Freq: Two times a day (BID) | ORAL | 2 refills | Status: DC
Start: 2022-07-26 — End: 2023-02-23
  Filled 2022-07-26 – 2022-08-24 (×2): qty 120, 30d supply, fill #0
  Filled 2022-09-29: qty 120, 30d supply, fill #1
  Filled 2022-11-11 (×2): qty 120, 30d supply, fill #2

## 2022-07-26 MED ORDER — VITAMIN D (ERGOCALCIFEROL) 1.25 MG (50000 UNIT) PO CAPS
50000.0000 [IU] | ORAL_CAPSULE | ORAL | 1 refills | Status: DC
Start: 2022-07-26 — End: 2023-03-17
  Filled 2022-07-26: qty 12, 84d supply, fill #0
  Filled 2023-02-20: qty 12, 84d supply, fill #1

## 2022-07-26 MED ORDER — ATENOLOL-CHLORTHALIDONE 50-25 MG PO TABS
1.0000 | ORAL_TABLET | Freq: Every day | ORAL | 1 refills | Status: DC
Start: 2022-07-26 — End: 2023-02-23
  Filled 2022-07-26 (×2): qty 90, 90d supply, fill #0
  Filled 2022-11-11 (×2): qty 90, 90d supply, fill #1

## 2022-07-26 MED ORDER — ESCITALOPRAM OXALATE 10 MG PO TABS
10.0000 mg | ORAL_TABLET | Freq: Every day | ORAL | 1 refills | Status: DC
Start: 2022-07-26 — End: 2023-02-23
  Filled 2022-07-26: qty 90, 90d supply, fill #0
  Filled 2022-11-11 (×2): qty 90, 90d supply, fill #1

## 2022-08-24 ENCOUNTER — Other Ambulatory Visit: Payer: Self-pay

## 2022-08-24 ENCOUNTER — Other Ambulatory Visit (HOSPITAL_COMMUNITY): Payer: Self-pay

## 2022-08-24 ENCOUNTER — Other Ambulatory Visit: Payer: Self-pay | Admitting: Oncology

## 2022-08-24 ENCOUNTER — Telehealth: Payer: 59 | Admitting: Physician Assistant

## 2022-08-24 DIAGNOSIS — R3989 Other symptoms and signs involving the genitourinary system: Secondary | ICD-10-CM

## 2022-08-24 DIAGNOSIS — Z006 Encounter for examination for normal comparison and control in clinical research program: Secondary | ICD-10-CM

## 2022-08-25 ENCOUNTER — Other Ambulatory Visit (HOSPITAL_COMMUNITY): Payer: Self-pay

## 2022-08-25 ENCOUNTER — Other Ambulatory Visit (HOSPITAL_COMMUNITY)
Admission: RE | Admit: 2022-08-25 | Discharge: 2022-08-25 | Disposition: A | Discharge status: Home / Self Care | Payer: 59 | Source: Ambulatory Visit

## 2022-08-25 DIAGNOSIS — Z006 Encounter for examination for normal comparison and control in clinical research program: Secondary | ICD-10-CM

## 2022-08-25 MED ORDER — NITROFURANTOIN MONOHYD MACRO 100 MG PO CAPS
100.0000 mg | ORAL_CAPSULE | Freq: Two times a day (BID) | ORAL | 0 refills | Status: DC
Start: 2022-08-25 — End: 2023-03-17
  Filled 2022-08-25: qty 10, 5d supply, fill #0

## 2022-08-25 NOTE — Progress Notes (Signed)
E-Visit for Urinary Problems  We are sorry that you are not feeling well.  Here is how we plan to help!  Based on what you shared with me it looks like you most likely have a simple urinary tract infection.  A UTI (Urinary Tract Infection) is a bacterial infection of the bladder.  Most cases of urinary tract infections are simple to treat but a key part of your care is to encourage you to drink plenty of fluids and watch your symptoms carefully.  I have prescribed MacroBid 100 mg twice a day for 5 days.  Your symptoms should gradually improve. Call us if the burning in your urine worsens, you develop worsening fever, back pain or pelvic pain or if your symptoms do not resolve after completing the antibiotic.  Urinary tract infections can be prevented by drinking plenty of water to keep your body hydrated.  Also be sure when you wipe, wipe from front to back and don't hold it in!  If possible, empty your bladder every 4 hours.  HOME CARE Drink plenty of fluids Compete the full course of the antibiotics even if the symptoms resolve Remember, when you need to go.go. Holding in your urine can increase the likelihood of getting a UTI! GET HELP RIGHT AWAY IF: You cannot urinate You get a high fever Worsening back pain occurs You see blood in your urine You feel sick to your stomach or throw up You feel like you are going to pass out  MAKE SURE YOU  Understand these instructions. Will watch your condition. Will get help right away if you are not doing well or get worse.   Thank you for choosing an e-visit.  Your e-visit answers were reviewed by a board certified advanced clinical practitioner to complete your personal care plan. Depending upon the condition, your plan could have included both over the counter or prescription medications.  Please review your pharmacy choice. Make sure the pharmacy is open so you can pick up prescription now. If there is a problem, you may contact your  provider through MyChart messaging and have the prescription routed to another pharmacy.  Your safety is important to us. If you have drug allergies check your prescription carefully.   For the next 24 hours you can use MyChart to ask questions about today's visit, request a non-urgent call back, or ask for a work or school excuse. You will get an email in the next two days asking about your experience. I hope that your e-visit has been valuable and will speed your recovery.  I have spent 5 minutes in review of e-visit questionnaire, review and updating patient chart, medical decision making and response to patient.   Debroah Shuttleworth M Mkayla Steele, PA-C  

## 2022-11-02 ENCOUNTER — Ambulatory Visit: Payer: 59 | Admitting: Family Medicine

## 2022-11-11 ENCOUNTER — Other Ambulatory Visit (HOSPITAL_COMMUNITY): Payer: Self-pay

## 2022-11-14 ENCOUNTER — Other Ambulatory Visit: Payer: Self-pay

## 2022-11-18 ENCOUNTER — Other Ambulatory Visit (HOSPITAL_COMMUNITY): Payer: Self-pay

## 2022-11-18 MED ORDER — RIFAMPIN 300 MG PO CAPS
300.0000 mg | ORAL_CAPSULE | Freq: Two times a day (BID) | ORAL | 0 refills | Status: AC
  Filled 2022-11-18: qty 42, 21d supply, fill #0

## 2022-11-18 MED ORDER — DOXYCYCLINE HYCLATE 100 MG PO TABS
100.0000 mg | ORAL_TABLET | Freq: Two times a day (BID) | ORAL | 0 refills | Status: AC
  Filled 2022-11-18: qty 42, 21d supply, fill #0

## 2022-11-23 ENCOUNTER — Other Ambulatory Visit (HOSPITAL_COMMUNITY): Payer: Self-pay

## 2022-11-23 ENCOUNTER — Telehealth: Payer: 59 | Admitting: Physician Assistant

## 2022-11-23 DIAGNOSIS — H60332 Swimmer's ear, left ear: Secondary | ICD-10-CM | POA: Diagnosis not present

## 2022-11-23 MED ORDER — CIPROFLOXACIN-DEXAMETHASONE 0.3-0.1 % OT SUSP
4.0000 [drp] | Freq: Two times a day (BID) | OTIC | 0 refills | Status: AC
Start: 2022-11-23 — End: 2022-11-30
  Filled 2022-11-23: qty 7.5, 7d supply, fill #0

## 2022-11-23 NOTE — Progress Notes (Signed)
E Visit for Ear Pain - Swimmer's Ear  We are sorry that you are not feeling well. Here is how we plan to help!  Based on what you have shared with me it looks like you have Swimmer's Ear.  Swimmer's ear is a redness or swelling, irritation, or infection of your outer ear canal. These symptoms usually occur within a few days of swimming. Your ear canal is a tube that goes from the opening of the ear to the eardrum.  When water stays in your ear canal, germs can grow.  This is a painful condition that often happens to children and swimmers of all ages.  It is not contagious and oral antibiotics are not required to treat uncomplicated swimmer's ear.  The usual symptoms include:    Itchiness inside the ear  Redness or a sense of swelling in the ear  Pain when the ear is tugged on when pressure is placed on the ear  Pus draining from the infected ear   I have prescribed Ciprodex drops to apply as directed.  In certain cases, swimmer's ear may progress to a more serious bacterial infection of the middle or inner ear.  If you have a fever 102 and up and significantly worsening symptoms, this could indicate a more serious infection moving to the middle/inner and needs face to face evaluation in an office by a provider.  Your symptoms should improve over the next 3 days and should resolve in about 7 days.  Be sure to complete ALL of your prescription.  HOME CARE: Wash your hands frequently. If you are prescribed an ear drop, do not place the tip of the bottle on your ear or touch it with your fingers. You can take Acetaminophen 650 mg every 4-6 hours as needed for pain.  If pain is severe or moderate, you can apply a heating pad (set on low) or hot water bottle (wrapped in a towel) to outer ear for 20 minutes.  This will also increase drainage. Avoid ear plugs Do not go swimming until the symptoms are gone Do not use Q-tips After showers, help the water run out by tilting your head to one  side.   GET HELP RIGHT AWAY IF: Fever is over 102.2 degrees. You develop progressive ear pain or hearing loss. Ear symptoms persist longer than 3 days after treatment.  MAKE SURE YOU: Understand these instructions. Will watch your condition. Will get help right away if you are not doing well or get worse.  TO PREVENT SWIMMER'S EAR: Use a bathing cap or custom fitted swim molds to keep your ears dry. Towel off after swimming to dry your ears. Tilt your head or pull your earlobes to allow the water to escape your ear canal. If there is still water in your ears, consider using a hairdryer on the lowest setting.  Thank you for choosing an e-visit.  Your e-visit answers were reviewed by a board certified advanced clinical practitioner to complete your personal care plan. Depending upon the condition, your plan could have included both over the counter or prescription medications.  Please review your pharmacy choice. Make sure the pharmacy is open so you can pick up the prescription now. If there is a problem, you may contact your provider through [employer] and have the prescription routed to another pharmacy.  Your safety is important to [place]. If you have drug allergies check your prescription carefully.   For the next 24 hours you can use MyChart to ask  questions about today's visit, request a non-urgent call back, or ask for a work or school excuse. You will get an email with a survey after your eVisit asking about your experience. We would appreciate your feedback. I hope that your e-visit has been valuable and will aid in your recovery.

## 2022-11-23 NOTE — Progress Notes (Signed)
I have spent 5 minutes in review of e-visit questionnaire, review and updating patient chart, medical decision making and response to patient.   Mia Milan Cody Jacklynn Dehaas, PA-C    

## 2022-11-29 LAB — HELIX MOLECULAR SCREEN: Genetic Analysis Overall Interpretation: NEGATIVE

## 2022-12-27 ENCOUNTER — Telehealth: Payer: 59 | Admitting: Physician Assistant

## 2022-12-27 DIAGNOSIS — J029 Acute pharyngitis, unspecified: Secondary | ICD-10-CM

## 2022-12-27 NOTE — Progress Notes (Signed)
I have spent 5 minutes in review of e-visit questionnaire, review and updating patient chart, medical decision making and response to patient.   Laure Kidney, PA-C

## 2022-12-27 NOTE — Progress Notes (Signed)
   Thank you for the details you included in the comment boxes. Those details are very helpful in determining the best course of treatment for you and help [place] to provide the best care.Because of your symptoms, we recommend that you schedule a Virtual Urgent Care video visit in order for the provider to better assess what is going on.  The provider will be able to give you a more accurate diagnosis and treatment plan if we can more freely discuss your symptoms and with the addition of a virtual examination.   If you change your visit to a video visit, we will bill your insurance (similar to an office visit) and you will not be charged for this e-Visit. You will be able to stay at home and speak with the first available [facility] advanced practice provider. The link to do a video visit is in the drop down Menu tab of your Welcome screen in MyChart.

## 2023-01-17 ENCOUNTER — Other Ambulatory Visit (HOSPITAL_COMMUNITY): Payer: Self-pay

## 2023-01-17 ENCOUNTER — Encounter (HOSPITAL_COMMUNITY): Payer: Self-pay

## 2023-01-17 ENCOUNTER — Other Ambulatory Visit: Payer: Self-pay | Admitting: Family Medicine

## 2023-02-20 ENCOUNTER — Other Ambulatory Visit: Payer: Self-pay

## 2023-02-20 ENCOUNTER — Encounter (HOSPITAL_COMMUNITY): Payer: Self-pay

## 2023-02-20 ENCOUNTER — Other Ambulatory Visit (HOSPITAL_COMMUNITY): Payer: Self-pay

## 2023-02-20 ENCOUNTER — Other Ambulatory Visit: Payer: Self-pay | Admitting: Family Medicine

## 2023-02-20 DIAGNOSIS — I10 Essential (primary) hypertension: Secondary | ICD-10-CM

## 2023-02-20 DIAGNOSIS — F330 Major depressive disorder, recurrent, mild: Secondary | ICD-10-CM

## 2023-02-22 ENCOUNTER — Ambulatory Visit: Payer: 59 | Admitting: Family Medicine

## 2023-02-23 ENCOUNTER — Other Ambulatory Visit (HOSPITAL_COMMUNITY): Payer: Self-pay

## 2023-02-23 ENCOUNTER — Other Ambulatory Visit: Payer: Self-pay

## 2023-02-23 ENCOUNTER — Other Ambulatory Visit: Payer: Self-pay | Admitting: Family Medicine

## 2023-02-23 DIAGNOSIS — F330 Major depressive disorder, recurrent, mild: Secondary | ICD-10-CM

## 2023-02-23 DIAGNOSIS — I10 Essential (primary) hypertension: Secondary | ICD-10-CM

## 2023-02-23 MED ORDER — ATENOLOL-CHLORTHALIDONE 50-25 MG PO TABS
1.0000 | ORAL_TABLET | Freq: Every day | ORAL | 0 refills | Status: DC
Start: 2023-02-23 — End: 2023-03-17
  Filled 2023-02-23: qty 30, 30d supply, fill #0

## 2023-02-23 MED ORDER — CONTRAVE 8-90 MG PO TB12
2.0000 | ORAL_TABLET | Freq: Two times a day (BID) | ORAL | 0 refills | Status: DC
Start: 2023-02-23 — End: 2023-03-17
  Filled 2023-02-23 – 2023-02-28 (×3): qty 120, 30d supply, fill #0

## 2023-02-23 MED ORDER — ESCITALOPRAM OXALATE 10 MG PO TABS
10.0000 mg | ORAL_TABLET | Freq: Every day | ORAL | 0 refills | Status: DC
  Filled 2023-02-23 (×2): qty 30, 30d supply, fill #0

## 2023-02-28 ENCOUNTER — Other Ambulatory Visit (HOSPITAL_COMMUNITY): Payer: Self-pay

## 2023-02-28 ENCOUNTER — Encounter: Payer: Self-pay | Admitting: Family Medicine

## 2023-03-10 ENCOUNTER — Other Ambulatory Visit (HOSPITAL_COMMUNITY): Payer: Self-pay

## 2023-03-17 ENCOUNTER — Other Ambulatory Visit: Payer: Self-pay

## 2023-03-17 ENCOUNTER — Ambulatory Visit (INDEPENDENT_AMBULATORY_CARE_PROVIDER_SITE_OTHER): Payer: 59 | Admitting: Family Medicine

## 2023-03-17 VITALS — BP 130/82 | HR 80 | Temp 97.9°F | Resp 18 | Ht 68.0 in | Wt >= 6400 oz

## 2023-03-17 DIAGNOSIS — F330 Major depressive disorder, recurrent, mild: Secondary | ICD-10-CM | POA: Diagnosis not present

## 2023-03-17 DIAGNOSIS — E559 Vitamin D deficiency, unspecified: Secondary | ICD-10-CM | POA: Diagnosis not present

## 2023-03-17 DIAGNOSIS — Z6841 Body Mass Index (BMI) 40.0 and over, adult: Secondary | ICD-10-CM | POA: Diagnosis not present

## 2023-03-17 DIAGNOSIS — I10 Essential (primary) hypertension: Secondary | ICD-10-CM

## 2023-03-17 DIAGNOSIS — E8881 Metabolic syndrome: Secondary | ICD-10-CM | POA: Diagnosis not present

## 2023-03-17 MED ORDER — BUPROPION HCL ER (XL) 150 MG PO TB24
150.0000 mg | ORAL_TABLET | Freq: Every day | ORAL | 1 refills | Status: DC
  Filled 2023-03-17: qty 90, 90d supply, fill #0
  Filled 2023-06-28: qty 90, 90d supply, fill #1

## 2023-03-17 MED ORDER — ATENOLOL-CHLORTHALIDONE 50-25 MG PO TABS
1.0000 | ORAL_TABLET | Freq: Every day | ORAL | 1 refills | Status: DC
  Filled 2023-03-17: qty 90, 90d supply, fill #0

## 2023-03-17 MED ORDER — ATENOLOL-CHLORTHALIDONE 50-25 MG PO TABS
1.5000 | ORAL_TABLET | Freq: Every day | ORAL | 1 refills | Status: DC
  Filled 2023-03-17 – 2023-03-23 (×2): qty 135, 90d supply, fill #0
  Filled 2023-06-28: qty 135, 90d supply, fill #1

## 2023-03-17 MED ORDER — ESCITALOPRAM OXALATE 10 MG PO TABS
10.0000 mg | ORAL_TABLET | Freq: Every day | ORAL | 1 refills | Status: DC
  Filled 2023-03-17: qty 90, 90d supply, fill #0
  Filled 2023-06-28: qty 90, 90d supply, fill #1

## 2023-03-17 MED ORDER — VITAMIN D (ERGOCALCIFEROL) 1.25 MG (50000 UNIT) PO CAPS
50000.0000 [IU] | ORAL_CAPSULE | ORAL | 1 refills | Status: DC
  Filled 2023-03-17: qty 12, 84d supply, fill #0

## 2023-03-17 NOTE — Progress Notes (Signed)
Name: Kimberly Cannon   MRN: [id number]    DOB: [birth year]   Date:03/17/2023       Progress Note  Subjective  Chief Complaint  Chief Complaint  Patient presents with   Medical Management of Chronic Issues   Medication Refill   HPI   HTN: she has been taking medications as prescribed, BP has been around 130's and for her age would like to be lower. She prefers not adding another medication so we will adjust dose of Atenolol Chlorthalidone to 1.5 tablets per day. She will also have labs done. Denies chest pain or palpitation   Morbid Obesity: she has tried [place], [place] and she responded to medication but no longer can afford it. Her weight has been as high as 501 lbs. She is willing to see a dietician.   Depression Mild Major recurrent: she has history of depression when she was teenager but never treated. Her step father went to prison early Summer 2018  mother got depressed and had other medical problems and she felt very sad, increase in appetite and weight gain, lack of motivation and it affected her sleep.  She has been taking Lexapro since Nov 2018 and we added Wellbutrin Feb 2019, we stopped wellbutrin because she was on Contrave but is now willing to resume it. She states has down days but mostly feeling well and wants to continue Lexapro   Vitamin D deficiency: last vitamin D level was 14, she states she prefers rx vitamin D and we will send rx    Chronic left knee pain: she was seen at [facility] , had steroid injections, pain is under control now   Metabolic syndrome: discussed low carb diet, recheck labs   Patient Active Problem List   Diagnosis Date Noted   Metabolic syndrome 08/06/2021   Bilateral impacted cerumen 08/06/2021   Depression, major, recurrent, mild (HCC) 08/06/2021   Morbid obesity with BMI of 70 and over, adult (HCC) 08/06/2021   BMI 70 and over, adult (HCC) 05/30/2018   Dermatofibroma 12/07/2017   Left anterior knee pain 03/17/2017   Lymphedema  08/04/2014   Acanthosis nigricans 08/02/2014   Benign essential HTN 08/02/2014   Allergic contact dermatitis 08/02/2014   Infrequent menses 08/02/2014   Bilateral polycystic ovarian syndrome 08/02/2014   Allergic rhinitis, seasonal 08/02/2014   Vitamin D deficiency 10/12/2007    No past surgical history on file.  Family History  Problem Relation Age of Onset   High blood pressure Mother    Thyroid disease Mother    Depression Mother    Obesity Mother    Cancer Father 3       colon cancer   Obesity Father    Sleep apnea Father    Cancer Paternal Grandmother    Diabetes Paternal Grandfather    Diabetes Paternal Uncle     Social History   Tobacco Use   Smoking status: Never   Smokeless tobacco: Never  Substance Use Topics   Alcohol use: No    Alcohol/week: 0.0 standard drinks of alcohol     Current Outpatient Medications:    atenolol-chlorthalidone (TENORETIC) 50-25 MG tablet, Take 1 tablet by mouth daily., Disp: 30 tablet, Rfl: 0   escitalopram (LEXAPRO) 10 MG tablet, Take 1 tablet (10 mg total) by mouth daily., Disp: 30 tablet, Rfl: 0   Vitamin D, Ergocalciferol, (DRISDOL) 1.25 MG (50000 UNIT) CAPS capsule, Take 1 capsule (50,000 Units total) by mouth every 7 (seven) days., Disp: 12 capsule, Rfl: 1  Allergies  Allergen Reactions   Penicillins     I personally reviewed active problem list, medication list, allergies with the patient/caregiver today.   ROS  Ten systems reviewed and is negative except as mentioned in HPI    Objective  Vitals:   03/17/23 1511  BP: 136/84  Pulse: 80  Resp: 18  Temp: 97.9 F (36.6 C)  SpO2: 91%  Weight: (!) 468 lb 9.6 oz (212.6 kg)  Height: 5\' 8"  (1.727 m)    Body mass index is 71.25 kg/m.  Physical Exam  Constitutional: Patient appears well-developed and well-nourished. Obese  No distress.  HEENT: head atraumatic, normocephalic, pupils equal and reactive to light, neck supple Cardiovascular: Normal rate, regular  rhythm and normal heart sounds.  No murmur heard. No BLE edema. Pulmonary/Chest: Effort normal and breath sounds normal. No respiratory distress. Abdominal: Soft.  There is no tenderness. Psychiatric: Patient has a normal mood and affect. behavior is normal. Judgment and thought content normal.   Diabetic Foot Exam:     PHQ2/9:    03/17/2023    3:19 PM 07/26/2022    2:55 PM 04/14/2022    3:34 PM 12/27/2021    7:46 AM 09/30/2021   10:43 AM  Depression screen PHQ 2/9  Decreased Interest 1 1 0 0 0  Down, Depressed, Hopeless 0 1 0 1 0  PHQ - 2 Score 1 2 0 1 0  Altered sleeping 1 0 0 0 0  Tired, decreased energy 0 1 1 0 0  Change in appetite 0 0 0 0 0  Feeling bad or failure about yourself  0 0 0 0 0  Trouble concentrating 0 0 0 0 0  Moving slowly or fidgety/restless 0 0 0 0 0  Suicidal thoughts 0 0 0 0 0  PHQ-9 Score 2 3 1 1  0  Difficult doing work/chores Somewhat difficult  Not difficult at all      phq 9 is negative  Fall Risk:    03/17/2023    3:12 PM 07/26/2022    2:55 PM 04/14/2022    3:34 PM 12/27/2021    7:45 AM 09/30/2021   10:37 AM  Fall Risk   Falls in the past year? 0 1 1 0 0  Number falls in past yr: 0 0 1 0 0  Injury with Fall? 0 0 0 0 0  Risk for fall due to :  No Fall Risks History of fall(s);Impaired balance/gait No Fall Risks No Fall Risks  Follow up Falls evaluation completed Falls prevention discussed  Falls prevention discussed Falls prevention discussed     Assessment & Plan  1. Benign essential HTN (Primary)  - COMPLETE METABOLIC PANEL WITH GFR - CBC with Differential/Platelet - atenolol-chlorthalidone (TENORETIC) 50-25 MG tablet; Take 1.5 tablet by mouth daily.  Dispense: 135 tablet; Refill: 1 - Amb ref to [facility] - return in 2 weeks for bp check   2. Morbid obesity with BMI of 70 and over, adult (HCC)  - Amb ref to Medical Nutrition Therapy-[facility]  3. Depression, major, recurrent, mild (HCC)  - escitalopram (LEXAPRO) 10  MG tablet; Take 1 tablet (10 mg total) by mouth daily.  Dispense: 90 tablet; Refill: 1 - buPROPion (WELLBUTRIN XL) 150 MG 24 hr tablet; Take 1 tablet (150 mg total) by mouth daily.  Dispense: 90 tablet; Refill: 1  4. Metabolic syndrome  - Hemoglobin A1c - Amb ref to Medical Nutrition Therapy-[facility]  5. Vitamin D deficiency  - VITAMIN D 25 Hydroxy (  Vit-D Deficiency, Fractures) - Vitamin D, Ergocalciferol, (DRISDOL) 1.25 MG (50000 UNIT) CAPS capsule; Take 1 capsule (50,000 Units total) by mouth every 7 (seven) days.  Dispense: 12 capsule; Refill: 1

## 2023-03-18 LAB — COMPLETE METABOLIC PANEL WITH GFR
AG Ratio: 1.2 (calc) (ref 1.0–2.5)
ALT: 18 U/L (ref 6–29)
AST: 17 U/L (ref 10–30)
Albumin: 4.2 g/dL (ref 3.6–5.1)
Alkaline phosphatase (APISO): 84 U/L (ref 31–125)
BUN: 16 mg/dL (ref 7–25)
CO2: 30 mmol/L (ref 20–32)
Calcium: 9.6 mg/dL (ref 8.6–10.2)
Chloride: 101 mmol/L (ref 98–110)
Creat: 0.68 mg/dL (ref 0.50–0.97)
Globulin: 3.6 g/dL (ref 1.9–3.7)
Glucose, Bld: 78 mg/dL (ref 65–99)
Potassium: 4 mmol/L (ref 3.5–5.3)
Sodium: 140 mmol/L (ref 135–146)
Total Bilirubin: 0.3 mg/dL (ref 0.2–1.2)
Total Protein: 7.8 g/dL (ref 6.1–8.1)
eGFR: 118 mL/min/{1.73_m2} (ref >=60)

## 2023-03-18 LAB — CBC WITH DIFFERENTIAL/PLATELET
Absolute Lymphocytes: 4183 {cells}/uL — ABNORMAL HIGH (ref 850–3900)
Absolute Monocytes: 540 {cells}/uL (ref 200–950)
Basophils Absolute: 42 {cells}/uL (ref 0–200)
Basophils Relative: 0.5 %
Eosinophils Absolute: 282 {cells}/uL (ref 15–500)
Eosinophils Relative: 3.4 %
HCT: 40.3 % (ref 35.0–45.0)
Hemoglobin: 13.4 g/dL (ref 11.7–15.5)
MCH: 28.5 pg (ref 27.0–33.0)
MCHC: 33.3 g/dL (ref 32.0–36.0)
MCV: 85.7 fL (ref 80.0–100.0)
MPV: 10.8 fL (ref 7.5–12.5)
Monocytes Relative: 6.5 %
Neutro Abs: 3254 {cells}/uL (ref 1500–7800)
Neutrophils Relative %: 39.2 %
Platelets: 333 10*3/uL (ref 140–400)
RBC: 4.7 10*6/uL (ref 3.80–5.10)
RDW: 13.2 % (ref 11.0–15.0)
Total Lymphocyte: 50.4 %
WBC: 8.3 10*3/uL (ref 3.8–10.8)

## 2023-03-18 LAB — VITAMIN D 25 HYDROXY (VIT D DEFICIENCY, FRACTURES): Vit D, 25-Hydroxy: 38 ng/mL (ref 30–100)

## 2023-03-18 LAB — HEMOGLOBIN A1C
Hgb A1c MFr Bld: 5.7 %{Hb} — ABNORMAL HIGH (ref <5.7)
Mean Plasma Glucose: 117 mg/dL
eAG (mmol/L): 6.5 mmol/L

## 2023-03-20 ENCOUNTER — Encounter: Payer: Self-pay | Admitting: Family Medicine

## 2023-03-20 ENCOUNTER — Other Ambulatory Visit: Payer: Self-pay

## 2023-03-23 ENCOUNTER — Other Ambulatory Visit: Payer: Self-pay

## 2023-03-31 ENCOUNTER — Ambulatory Visit: Payer: 59

## 2023-03-31 VITALS — BP 130/72

## 2023-03-31 DIAGNOSIS — Z0130 Encounter for examination of blood pressure without abnormal findings: Secondary | ICD-10-CM

## 2023-08-29 ENCOUNTER — Encounter: Payer: Self-pay | Admitting: Family Medicine

## 2023-08-29 ENCOUNTER — Other Ambulatory Visit: Payer: Self-pay | Admitting: Family Medicine

## 2023-08-29 DIAGNOSIS — F330 Major depressive disorder, recurrent, mild: Secondary | ICD-10-CM

## 2023-08-30 ENCOUNTER — Other Ambulatory Visit: Payer: Self-pay

## 2023-08-30 ENCOUNTER — Other Ambulatory Visit: Payer: Self-pay | Admitting: Family Medicine

## 2023-08-30 DIAGNOSIS — F330 Major depressive disorder, recurrent, mild: Secondary | ICD-10-CM

## 2023-08-30 MED ORDER — BUPROPION HCL ER (XL) 300 MG PO TB24
300.0000 mg | ORAL_TABLET | Freq: Every day | ORAL | 0 refills | Status: DC
  Filled 2023-08-30: qty 30, 30d supply, fill #0

## 2023-09-11 ENCOUNTER — Other Ambulatory Visit: Payer: Self-pay

## 2023-09-11 ENCOUNTER — Encounter: Payer: Self-pay | Admitting: Family Medicine

## 2023-09-11 ENCOUNTER — Ambulatory Visit: Payer: 59 | Admitting: Family Medicine

## 2023-09-11 VITALS — BP 128/74 | HR 82 | Resp 16 | Ht 68.0 in | Wt >= 6400 oz

## 2023-09-11 DIAGNOSIS — E8881 Metabolic syndrome: Secondary | ICD-10-CM | POA: Diagnosis not present

## 2023-09-11 DIAGNOSIS — I10 Essential (primary) hypertension: Secondary | ICD-10-CM

## 2023-09-11 DIAGNOSIS — G8929 Other chronic pain: Secondary | ICD-10-CM | POA: Diagnosis not present

## 2023-09-11 DIAGNOSIS — E559 Vitamin D deficiency, unspecified: Secondary | ICD-10-CM | POA: Diagnosis not present

## 2023-09-11 DIAGNOSIS — M25562 Pain in left knee: Secondary | ICD-10-CM | POA: Diagnosis not present

## 2023-09-11 DIAGNOSIS — Z6841 Body Mass Index (BMI) 40.0 and over, adult: Secondary | ICD-10-CM

## 2023-09-11 DIAGNOSIS — F330 Major depressive disorder, recurrent, mild: Secondary | ICD-10-CM | POA: Diagnosis not present

## 2023-09-11 MED ORDER — VITAMIN D (ERGOCALCIFEROL) 1.25 MG (50000 UNIT) PO CAPS
50000.0000 [IU] | ORAL_CAPSULE | ORAL | 1 refills | Status: AC
  Filled 2023-09-11 – 2024-01-26 (×3): qty 12, 84d supply, fill #0

## 2023-09-11 MED ORDER — ATENOLOL-CHLORTHALIDONE 50-25 MG PO TABS
1.5000 | ORAL_TABLET | Freq: Every day | ORAL | 1 refills | Status: AC
  Filled 2023-09-11 – 2023-10-10 (×2): qty 135, 90d supply, fill #0
  Filled 2024-01-26 (×2): qty 135, 90d supply, fill #1

## 2023-09-11 MED ORDER — ESCITALOPRAM OXALATE 10 MG PO TABS
10.0000 mg | ORAL_TABLET | Freq: Every day | ORAL | 1 refills | Status: AC
  Filled 2023-09-11 – 2023-10-10 (×2): qty 90, 90d supply, fill #0
  Filled 2024-01-26 (×2): qty 90, 90d supply, fill #1

## 2023-09-11 MED ORDER — BUPROPION HCL ER (XL) 300 MG PO TB24
300.0000 mg | ORAL_TABLET | Freq: Every day | ORAL | 1 refills | Status: AC
  Filled 2023-09-11 – 2023-10-10 (×2): qty 90, 90d supply, fill #0
  Filled 2024-01-26 (×2): qty 90, 90d supply, fill #1

## 2023-09-11 NOTE — Progress Notes (Signed)
 Name: Kimberly Cannon   MRN: [id number]    DOB: 24-Jul-1990   Date:09/11/2023       Progress Note  Subjective  Chief Complaint  Chief Complaint  Patient presents with   Medical Management of Chronic Issues   Discussed the use of AI scribe software for clinical note transcription with the patient, who gave verbal consent to proceed.  History of Present Illness Kimberly Cannon is a 33 year old female with obesity and hypertension who presents for a six-month follow-up visit.  She has been actively working on weight loss, successfully reducing her weight from 468 pounds in January to 456 pounds currently, marking a loss of over 12 pounds. Her highest recorded weight was 501 pounds, indicating a total weight loss of nearly 50 pounds. She attributes her weight loss to increased physical activity, including using the workout room at the hospital four times a week, where she engages in free weights and treadmill exercises. She avoids the bike due to knee irritation. She has also made dietary changes, such as reducing fat intake and cutting back on carbohydrates, opting for diet beverages and low-carb bread options.  She has a history of benign hypertension and is currently taking atenolol  and chlorthalidone , 50 mg and 25 mg respectively, at a dose of one and a half pills daily. No side effects from her medication and no chest pain or palpitations.  She experiences chronic left knee pain and is managing without surgical intervention.  She has a history of vitamin D  deficiency and is taking a supplement of 50,000 IU once a week.  She has a history of depression and is currently on Wellbutrin  and Lexapro . Physical activity has been beneficial for her mental health, although she occasionally experiences days where she feels unmotivated, describing these as days where she has to 'talk herself into doing stuff.'    Patient Active Problem List   Diagnosis Date Noted   Metabolic syndrome 08/06/2021    Bilateral impacted cerumen 08/06/2021   Depression, major, recurrent, mild (HCC) 08/06/2021   Morbid obesity with BMI of 70 and over, adult (HCC) 08/06/2021   Dermatofibroma 12/07/2017   Left anterior knee pain 03/17/2017   Lymphedema 08/04/2014   Acanthosis nigricans 08/02/2014   Benign essential HTN 08/02/2014   Allergic contact dermatitis 08/02/2014   Infrequent menses 08/02/2014   Bilateral polycystic ovarian syndrome 08/02/2014   Allergic rhinitis, seasonal 08/02/2014   Vitamin D  deficiency 10/12/2007    No past surgical history on file.  Family History  Problem Relation Age of Onset   High blood pressure Mother    Thyroid disease Mother    Depression Mother    Obesity Mother    Cancer Father 32       colon cancer   Obesity Father    Sleep apnea Father    Cancer Paternal Grandmother    Diabetes Paternal Grandfather    Diabetes Paternal Uncle     Social History   Tobacco Use   Smoking status: Never   Smokeless tobacco: Never  Substance Use Topics   Alcohol use: No    Alcohol/week: 0.0 standard drinks of alcohol     Current Outpatient Medications:    atenolol -chlorthalidone  (TENORETIC ) 50-25 MG tablet, Take 1.5 tablets by mouth daily., Disp: 135 tablet, Rfl: 1   buPROPion  (WELLBUTRIN  XL) 300 MG 24 hr tablet, Take 1 tablet (300 mg total) by mouth daily., Disp: 30 tablet, Rfl: 0   escitalopram  (LEXAPRO ) 10 MG tablet, Take 1 tablet (10  mg total) by mouth daily., Disp: 90 tablet, Rfl: 1   Vitamin D , Ergocalciferol , (DRISDOL ) 1.25 MG (50000 UNIT) CAPS capsule, Take 1 capsule (50,000 Units total) by mouth every 7 (seven) days., Disp: 12 capsule, Rfl: 1  Allergies  Allergen Reactions   Penicillins     I personally reviewed active problem list, medication list, allergies with the patient/caregiver today.   ROS  Ten systems reviewed and is negative except as mentioned in HPI    Objective Physical Exam  CONSTITUTIONAL: Patient appears well-developed and  well-nourished.  No distress. HEENT: Head atraumatic, normocephalic, neck supple. CARDIOVASCULAR: Normal rate, regular rhythm and normal heart sounds.  No murmur heard. No BLE edema. PULMONARY: Effort normal and breath sounds normal. No respiratory distress. ABDOMINAL: There is no tenderness or distention. MUSCULOSKELETAL: Normal gait. Without gross motor or sensory deficit. PSYCHIATRIC: Patient has a normal mood and affect. behavior is normal. Judgment and thought content normal.  Vitals:   09/11/23 1507  BP: 128/74  Pulse: 82  Resp: 16  SpO2: 98%  Weight: (!) 456 lb (206.8 kg)  Height: 5' 8 (1.727 m)    Body mass index is 69.33 kg/m.   PHQ2/9:    09/11/2023    3:10 PM 03/17/2023    3:19 PM 07/26/2022    2:55 PM 04/14/2022    3:34 PM 12/27/2021    7:46 AM  Depression screen PHQ 2/9  Decreased Interest 0 1 1 0 0  Down, Depressed, Hopeless 0 0 1 0 1  PHQ - 2 Score 0 1 2 0 1  Altered sleeping 0 1 0 0 0  Tired, decreased energy 0 0 1 1 0  Change in appetite 0 0 0 0 0  Feeling bad or failure about yourself  0 0 0 0 0  Trouble concentrating 0 0 0 0 0  Moving slowly or fidgety/restless 0 0 0 0 0  Suicidal thoughts 0 0 0 0 0  PHQ-9 Score 0 2 3 1 1   Difficult doing work/chores  Somewhat difficult  Not difficult at all     phq 9 is negative  Fall Risk:    03/17/2023    3:12 PM 07/26/2022    2:55 PM 04/14/2022    3:34 PM 12/27/2021    7:45 AM 09/30/2021   10:37 AM  Fall Risk   Falls in the past year? 0 1 1 0 0  Number falls in past yr: 0 0 1 0 0  Injury with Fall? 0 0 0 0 0  Risk for fall due to :  No Fall Risks History of fall(s);Impaired balance/gait No Fall Risks No Fall Risks  Follow up Falls evaluation completed Falls prevention discussed  Falls prevention discussed  Falls prevention discussed      Data saved with a previous flowsheet row definition     Assessment & Plan Morbid obesity due to excess calories BMI reduced from over 70 to below 70. Weight decreased  from 468 to 456 pounds, total loss of nearly 50 pounds. Achieved through increased physical activity and dietary modifications. No weight loss medications due to cost and insurance issues. Discussed intermittent fasting benefits. - Continue exercise regimen, free weights and treadmill, at least four times a week. - Encourage dietary modifications, reduce fat intake, avoid fried foods. - Consider intermittent fasting, feeding window 12 PM to 6 PM. - Explore insurance coverage for dietitian consultation through My Active [employer] program.  Prediabetes Managed with dietary modifications and sugar-free beverages. Emphasized metabolic syndrome management through  lifestyle changes. - Continue dietary modifications to reduce carbohydrate intake. - Encourage participation in My [facility] program for additional support.  Essential hypertension Well-controlled with BP 128/74 mmHg. Current regimen of atenolol  and chlorthalidone  effective without side effects. - Continue atenolol  and chlorthalidone , 50/25 mg, one and a half pills daily. - Provide six-month refill for atenolol  and chlorthalidone .  Unilateral primary osteoarthritis, left knee Chronic left knee pain likely due to osteoarthritis. Managed with activity modifications. No surgical intervention planned. - Continue activity modifications to manage knee pain. - Avoid activities that exacerbate knee pain, such as biking.  Major depressive disorder mild recurrent  Doing better with mood and self-care due to increased physical activity. Occasional low mood days, significant improvement with Wellbutrin  and Lexapro . - Continue Wellbutrin  and Lexapro  regimen. - Provide six-month refill for Wellbutrin  and Lexapro .  Vitamin D  deficiency Managed with supplementation, currently taking 50,000 IU weekly. - Continue vitamin D  supplementation, 50,000 IU once weekly. - Provide six-month refill for vitamin D .

## 2023-09-22 ENCOUNTER — Other Ambulatory Visit: Payer: Self-pay

## 2023-10-10 ENCOUNTER — Other Ambulatory Visit: Payer: Self-pay

## 2023-11-30 ENCOUNTER — Other Ambulatory Visit: Payer: Self-pay

## 2023-11-30 ENCOUNTER — Ambulatory Visit
Admission: RE | Admit: 2023-11-30 | Discharge: 2023-11-30 | Disposition: A | Discharge status: Home / Self Care | Attending: Nurse Practitioner | Admitting: Nurse Practitioner

## 2023-11-30 ENCOUNTER — Encounter: Payer: Self-pay | Admitting: Nurse Practitioner

## 2023-11-30 ENCOUNTER — Ambulatory Visit: Admitting: Nurse Practitioner

## 2023-11-30 ENCOUNTER — Ambulatory Visit
Admission: RE | Admit: 2023-11-30 | Discharge: 2023-11-30 | Disposition: A | Discharge status: Home / Self Care | Source: Ambulatory Visit | Attending: Nurse Practitioner | Admitting: Nurse Practitioner

## 2023-11-30 ENCOUNTER — Ambulatory Visit: Payer: Self-pay | Admitting: Nurse Practitioner

## 2023-11-30 VITALS — BP 118/70 | HR 83 | Temp 99.0°F | Resp 16 | Ht 68.0 in | Wt >= 6400 oz

## 2023-11-30 DIAGNOSIS — R0602 Shortness of breath: Secondary | ICD-10-CM | POA: Diagnosis not present

## 2023-11-30 DIAGNOSIS — R079 Chest pain, unspecified: Secondary | ICD-10-CM | POA: Diagnosis not present

## 2023-11-30 DIAGNOSIS — R051 Acute cough: Secondary | ICD-10-CM

## 2023-11-30 DIAGNOSIS — R6889 Other general symptoms and signs: Secondary | ICD-10-CM

## 2023-11-30 DIAGNOSIS — J189 Pneumonia, unspecified organism: Secondary | ICD-10-CM | POA: Diagnosis not present

## 2023-11-30 DIAGNOSIS — R918 Other nonspecific abnormal finding of lung field: Secondary | ICD-10-CM | POA: Diagnosis not present

## 2023-11-30 LAB — POC COVID19/FLU A&B COMBO
Covid Antigen, POC: NEGATIVE
Influenza A Antigen, POC: NEGATIVE
Influenza B Antigen, POC: NEGATIVE

## 2023-11-30 MED ORDER — PROMETHAZINE-DM 6.25-15 MG/5ML PO SYRP
5.0000 mL | ORAL_SOLUTION | Freq: Four times a day (QID) | ORAL | 0 refills | Status: AC | PRN
  Filled 2023-11-30: qty 118, 6d supply, fill #0

## 2023-11-30 MED ORDER — DOXYCYCLINE HYCLATE 100 MG PO TABS
100.0000 mg | ORAL_TABLET | Freq: Two times a day (BID) | ORAL | 0 refills | Status: AC
  Filled 2023-11-30: qty 10, 5d supply, fill #0

## 2023-11-30 MED ORDER — BENZONATATE 100 MG PO CAPS
100.0000 mg | ORAL_CAPSULE | Freq: Two times a day (BID) | ORAL | 0 refills | Status: AC | PRN
  Filled 2023-11-30: qty 20, 10d supply, fill #0

## 2023-11-30 NOTE — Progress Notes (Signed)
 BP 118/70   Pulse 83   Temp 99 F (37.2 C)   Resp 16   Ht 5' 8 (1.727 m)   Wt (!) 445 lb (201.9 kg)   LMP 11/09/2023   SpO2 94%   BMI 67.66 kg/m    Subjective:    Patient ID: Kimberly Cannon, female    DOB: 01-16-1991, 33 y.o.   MRN: [id number]\\  Chief Complaint: Cough and Body aches   HPI: Kimberly Cannon is a 33 y.o. female presenting today with low-grade fever of 99.0, body aches, and a non-productive cough that began on Sunday. She has had some shortness of breath when walking and also some chest pain. Denies palpitations. She also endorses right ear pain. Reports taking tylenol and nyquil for symptoms, and has provided some relief. She recently traveled on an airplane and wonders if she picked something up there. Requesting COVID and Flu testing.      09/11/2023    3:10 PM 03/17/2023    3:19 PM 07/26/2022    2:55 PM  Depression screen PHQ 2/9  Decreased Interest 0 1 1  Down, Depressed, Hopeless 0 0 1  PHQ - 2 Score 0 1 2  Altered sleeping 0 1 0  Tired, decreased energy 0 0 1  Change in appetite 0 0 0  Feeling bad or failure about yourself  0 0 0  Trouble concentrating 0 0 0  Moving slowly or fidgety/restless 0 0 0  Suicidal thoughts 0 0 0  PHQ-9 Score 0 2 3  Difficult doing work/chores  Somewhat difficult     Relevant past medical, surgical, family and social history reviewed and updated as indicated. Interim medical history since our last visit reviewed. Allergies and medications reviewed and updated.  Review of Systems  Ten systems reviewed and is negative except as mentioned in HPI      Objective:     BP 118/70   Pulse 83   Temp 99 F (37.2 C)   Resp 16   Ht 5' 8 (1.727 m)   Wt (!) 445 lb (201.9 kg)   LMP 11/09/2023   SpO2 94%   BMI 67.66 kg/m    Wt Readings from Last 3 Encounters:  11/30/23 (!) 445 lb (201.9 kg)  09/11/23 (!) 456 lb (206.8 kg)  03/17/23 (!) 468 lb 9.6 oz (212.6 kg)    Physical Exam Constitutional:      Appearance:  Normal appearance.  HENT:     Head: Normocephalic and atraumatic.     Right Ear: Tympanic membrane normal.     Left Ear: Tympanic membrane normal.     Nose: Nose normal.     Mouth/Throat:     Mouth: Mucous membranes are moist.  Cardiovascular:     Rate and Rhythm: Normal rate and regular rhythm.     Pulses: Normal pulses.     Heart sounds: Normal heart sounds.  Pulmonary:     Effort: No respiratory distress.     Breath sounds: No stridor. Rales present.  Musculoskeletal:        General: Normal range of motion.     Cervical back: Normal range of motion and neck supple.     Right lower leg: No edema.     Left lower leg: No edema.  Skin:    General: Skin is warm and dry.  Neurological:     General: No focal deficit present.     Mental Status: She is alert and oriented to person, place,  and time.  Psychiatric:        Mood and Affect: Mood normal.        Behavior: Behavior normal.        Thought Content: Thought content normal.        Judgment: Judgment normal.      Results for orders placed or performed in visit on 11/30/23  POC Covid19/Flu A&B Antigen   Collection Time: 11/30/23 10:41 AM  Result Value Ref Range   Influenza A Antigen, POC Negative Negative   Influenza B Antigen, POC Negative Negative   Covid Antigen, POC Negative Negative          Assessment & Plan:   Problem List Items Addressed This Visit   None Visit Diagnoses       Flu-like symptoms    -  Primary   Covid and Flu testing both negative   Relevant Orders   POC Covid19/Flu A&B Antigen (Completed)     Acute cough       Begin taking tessalon  perles twice daily as needed for cough. Rx sent in for promethazine DM   Relevant Medications   benzonatate  (TESSALON ) 100 MG capsule   promethazine-dextromethorphan (PROMETHAZINE-DM) 6.25-15 MG/5ML syrup   Other Relevant Orders   DG Chest 2 View (Completed)     Shortness of breath       EKG performed and unremarkable. Chest X-ray ordered. We will decided  about labwork once chest x-ray results.   Relevant Orders   EKG 12-Lead   DG Chest 2 View (Completed)     Chest pain, unspecified type       EKG unremarkable. Chest X-ray ordered.   Relevant Orders   EKG 12-Lead   DG Chest 2 View (Completed)     Community acquired pneumonia of right lower lobe of lung       Relevant Medications   benzonatate  (TESSALON ) 100 MG capsule   promethazine-dextromethorphan (PROMETHAZINE-DM) 6.25-15 MG/5ML syrup   doxycycline  (VIBRA -TABS) 100 MG tablet     -COVID and Flu testing were both negative  -EKG unremarkable, Normal Sinus Rhythm  -Chest X-ray ordered STAT due to shortness of breath, chest pain, and rales noted on examination. Plan to inform patient of results when they have resulted and then we can tailor plan accordingly. Discussed potential for labwork.  -Begin taking tessalon  perles two times daily as needed for cough -Rx sent in for promethazine DM to take 4 times daily as needed for cough  -Advised plenty of rest and hydration -Advised warm liquids, such as tea with honey -Note written for patient to be excused from work today and tomorrow.  Recommend taking zyrtec, flonase , mucinex , vitamin d , vitamin c, and zinc. Push fluids and get rest.       Chest xray resulted, showing pneumonia -called and notified patient,  doxycycline  sent in for treatment due to penicillin allergy.       Follow up plan: Return if symptoms worsen or fail to improve.  Follow up on Monday if no improvement  I have reviewed this encounter including the documentation in this note and/or discussed this patient with the provider, Aislinn Womack, SNP, I am certifying that I agree with the content of this note as supervising/preceptor nurse practitioner.  Mliss Spray, FNP-C [facility] 11/30/2023, 12:41 PM

## 2023-12-18 ENCOUNTER — Encounter: Admitting: Family Medicine

## 2024-01-26 ENCOUNTER — Other Ambulatory Visit: Payer: Self-pay

## 2024-03-13 ENCOUNTER — Ambulatory Visit: Admitting: Family Medicine
